# Patient Record
Sex: Female | Born: 1995 | Race: Black or African American | Hispanic: No | Marital: Married | Smoking: Never smoker
Health system: Southern US, Community
[De-identification: ages and names within clinical notes are randomized; demographics above are authoritative.]

## PROBLEM LIST (undated history)

## (undated) DIAGNOSIS — I10 Essential (primary) hypertension: Secondary | ICD-10-CM

## (undated) DIAGNOSIS — N390 Urinary tract infection, site not specified: Secondary | ICD-10-CM

## (undated) DIAGNOSIS — Z789 Other specified health status: Secondary | ICD-10-CM

## (undated) HISTORY — DX: Urinary tract infection, site not specified: N39.0

## (undated) HISTORY — DX: Other specified health status: Z78.9

## (undated) HISTORY — PX: TONSILLECTOMY: SUR1361

---
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
## 2016-06-29 ENCOUNTER — Encounter (INDEPENDENT_AMBULATORY_CARE_PROVIDER_SITE_OTHER): Payer: Self-pay

## 2016-06-29 ENCOUNTER — Encounter: Payer: Self-pay | Admitting: Family Medicine

## 2016-06-29 ENCOUNTER — Ambulatory Visit (INDEPENDENT_AMBULATORY_CARE_PROVIDER_SITE_OTHER): Payer: BLUE CROSS/BLUE SHIELD | Admitting: Family Medicine

## 2016-06-29 DIAGNOSIS — IMO0001 Reserved for inherently not codable concepts without codable children: Secondary | ICD-10-CM

## 2016-06-29 DIAGNOSIS — R030 Elevated blood-pressure reading, without diagnosis of hypertension: Secondary | ICD-10-CM

## 2016-06-29 DIAGNOSIS — I10 Essential (primary) hypertension: Secondary | ICD-10-CM | POA: Insufficient documentation

## 2016-06-29 DIAGNOSIS — E669 Obesity, unspecified: Secondary | ICD-10-CM | POA: Diagnosis not present

## 2016-06-29 NOTE — Patient Instructions (Addendum)
Nice to meet you. Please start working on diet and exercise as we discussed. I have included some dietary instructions. We will have our return for a physical and recheck blood pressure at that time.  Patient can be placed in any 11:15 time slot for a 30 minute visit if this works for her. If this does not work for her reschedule her in the first 30 minute office visit that is available and schedule a nurse visit for 2 weeks for a blood pressure check.

## 2016-06-29 NOTE — Assessment & Plan Note (Signed)
Slightly elevated on initial check and still slightly elevated on recheck. Asymptomatic. Likely related to obesity and family history. Discussed diet and exercise. She will return for a physical in the next 2-4 weeks and we will recheck this. If unable to come for a physical at that time. We will have her return for nurse visit in 2 weeks for recheck. She will also check at home.

## 2016-06-29 NOTE — Progress Notes (Signed)
Pre visit review using our clinic review tool, if applicable. No additional management support is needed unless otherwise documented below in the visit note. 

## 2016-06-29 NOTE — Assessment & Plan Note (Signed)
Discussed the importance of diet and exercise. Patient will start exercising 2-3 days a week and incorporate more fruits and vegetables into her diet.

## 2016-06-29 NOTE — Progress Notes (Signed)
Marikay AlarEric Yaneli Keithley, MD Phone: [phone]  Carrie Skinner is a 20 y.o. female who presents today for new patient visit.  Elevated blood pressure: Patient notes no prior history of elevated blood pressure. Notes she has gained some weight recently and this may be contributing. Has not been very active recently also eating out a lot. Does note a family history of hypertension. Family history of a cousin who had a stroke in his 1530s.  Obesity: Patient notes she has gained some weight recently. Is not doing anything for exercise at this time. Does not eat well. Eats out a fair amount. Notes in the past she has been able to drop weight very easily when she gets on a schedule. Used to be fairly physically active and played multiple sports in high school.  Active Ambulatory Problems    Diagnosis Date Noted  . Elevated blood pressure 06/29/2016  . Obesity 06/29/2016   Resolved Ambulatory Problems    Diagnosis Date Noted  . No Resolved Ambulatory Problems   Past Medical History:  Diagnosis Date  . UTI (lower urinary tract infection)     Family History  Problem Relation Age of Onset  . Arthritis Other   . Breast cancer Other   . Hypertension Other   . Diabetes Other   . Stroke Cousin     Early 30s    Social History   Social History  . Marital status: Single    Spouse name: N/A  . Number of children: N/A  . Years of education: N/A   Occupational History  . Not on file.   Social History Main Topics  . Smoking status: Never Smoker  . Smokeless tobacco: Never Used  . Alcohol use 1.2 oz/week    2 Cans of beer per week  . Drug use: No  . Sexual activity: Yes    Partners: Male    Birth control/ protection: Pill   Other Topics Concern  . 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    ROS  General:  Negative for nexplained weight loss, fever Skin: Negative for new or changing mole, sore that won't heal HEENT: Negative for  trouble hearing, trouble seeing, ringing in ears, mouth sores, hoarseness, change in voice, dysphagia. CV:  Negative for chest pain, dyspnea, edema, palpitations Resp: Negative for cough, dyspnea, hemoptysis GI: Negative for nausea, vomiting, diarrhea, constipation, abdominal pain, melena, hematochezia. GU: Negative for dysuria, incontinence, urinary hesitance, hematuria, vaginal or penile discharge, polyuria, sexual difficulty, lumps in testicle or breasts MSK: Negative for muscle cramps or aches, joint pain or swelling Neuro: Negative for headaches, weakness, numbness, dizziness, passing out/fainting Psych: Negative for depression, anxiety, memory problems  Objective  Physical Exam Vitals:   06/29/16 1435 06/29/16 1452  BP: (!) 148/96 (!) 134/96  Pulse: 83   Temp: 98.2 F (36.8 C)     BP Readings from Last 3 Encounters:  06/29/16 (!) 134/96   Wt Readings from Last 3 Encounters:  06/29/16 235 lb 6.4 oz (106.8 kg)    Physical Exam  Constitutional: No distress.  HENT:  Head: Normocephalic and atraumatic.  Mouth/Throat: Oropharynx is clear and moist. No oropharyngeal exudate.  Eyes: Conjunctivae are normal. Pupils are equal, round, and reactive to light.  Neck: Neck supple.  Cardiovascular: Normal rate, regular rhythm and normal heart sounds.   Pulmonary/Chest: Effort normal and breath sounds normal.  Abdominal: Soft. Bowel sounds are normal. She exhibits no distension. There is  no tenderness.  Musculoskeletal: She exhibits no edema.  Lymphadenopathy:    She has no cervical adenopathy.  Neurological: She is alert. Gait normal.  Skin: Skin is warm and dry. She is not diaphoretic.  Psychiatric: Mood and affect normal.     Assessment/Plan:   Elevated blood pressure Slightly elevated on initial check and still slightly elevated on recheck. Asymptomatic. Likely related to obesity and family history. Discussed diet and exercise. She will return for a physical in the next 2-4  weeks and we will recheck this. If unable to come for a physical at that time. We will have her return for nurse visit in 2 weeks for recheck. She will also check at home.  Obesity Discussed the importance of diet and exercise. Patient will start exercising 2-3 days a week and incorporate more fruits and vegetables into her diet.   No orders of the defined types were placed in this encounter.   No orders of the defined types were placed in this encounter.    Marikay Alar, MD [facility]

## 2016-07-08 ENCOUNTER — Institutional Professional Consult (permissible substitution): Payer: BLUE CROSS/BLUE SHIELD

## 2017-02-09 ENCOUNTER — Encounter: Payer: Self-pay | Admitting: Family Medicine

## 2017-02-09 ENCOUNTER — Other Ambulatory Visit (HOSPITAL_COMMUNITY)
Admission: RE | Admit: 2017-02-09 | Discharge: 2017-02-09 | Disposition: A | Discharge status: Home / Self Care | Payer: BLUE CROSS/BLUE SHIELD | Source: Ambulatory Visit | Attending: Family Medicine | Admitting: Family Medicine

## 2017-02-09 ENCOUNTER — Ambulatory Visit (INDEPENDENT_AMBULATORY_CARE_PROVIDER_SITE_OTHER): Payer: BLUE CROSS/BLUE SHIELD | Admitting: Family Medicine

## 2017-02-09 VITALS — BP 142/100 | HR 100 | Temp 99.1°F | Ht 65.5 in | Wt 245.0 lb

## 2017-02-09 DIAGNOSIS — I10 Essential (primary) hypertension: Secondary | ICD-10-CM | POA: Diagnosis not present

## 2017-02-09 DIAGNOSIS — Z13220 Encounter for screening for lipoid disorders: Secondary | ICD-10-CM

## 2017-02-09 DIAGNOSIS — Z124 Encounter for screening for malignant neoplasm of cervix: Secondary | ICD-10-CM | POA: Diagnosis not present

## 2017-02-09 DIAGNOSIS — Z114 Encounter for screening for human immunodeficiency virus [HIV]: Secondary | ICD-10-CM | POA: Insufficient documentation

## 2017-02-09 DIAGNOSIS — Z0001 Encounter for general adult medical examination with abnormal findings: Secondary | ICD-10-CM | POA: Diagnosis not present

## 2017-02-09 LAB — COMPREHENSIVE METABOLIC PANEL
ALT: 13 U/L (ref 0–35)
AST: 14 U/L (ref 0–37)
Albumin: 4.3 g/dL (ref 3.5–5.2)
Alkaline Phosphatase: 44 U/L (ref 39–117)
BUN: 11 mg/dL (ref 6–23)
CO2: 25 meq/L (ref 19–32)
Calcium: 9.4 mg/dL (ref 8.4–10.5)
Chloride: 104 mEq/L (ref 96–112)
Creatinine, Ser: 0.65 mg/dL (ref 0.40–1.20)
GFR: 145.68 mL/min (ref >60.00)
GLUCOSE: 101 mg/dL — AB (ref 70–99)
POTASSIUM: 3.9 meq/L (ref 3.5–5.1)
SODIUM: 136 meq/L (ref 135–145)
Total Bilirubin: 0.3 mg/dL (ref 0.2–1.2)
Total Protein: 7.3 g/dL (ref 6.0–8.3)

## 2017-02-09 LAB — LIPID PANEL
Cholesterol: 148 mg/dL (ref 0–200)
HDL: 46.4 mg/dL (ref >39.00)
LDL Cholesterol: 79 mg/dL (ref 0–99)
NonHDL: 101.95
Total CHOL/HDL Ratio: 3
Triglycerides: 117 mg/dL (ref 0.0–149.0)
VLDL: 23.4 mg/dL (ref 0.0–40.0)

## 2017-02-09 LAB — HEMOGLOBIN A1C: HEMOGLOBIN A1C: 5.5 % (ref 4.6–6.5)

## 2017-02-09 NOTE — Progress Notes (Signed)
Pre visit review using our clinic review tool, if applicable. No additional management support is needed unless otherwise documented below in the visit note. 

## 2017-02-09 NOTE — Patient Instructions (Signed)
Nice to see you. We will get some lab work and send your Pap smear off. Once these return we will give me a call. You should work on diet and exercises as we discussed. The goal is 150 minutes of exercise weekly.

## 2017-02-09 NOTE — Progress Notes (Signed)
Tommi Rumps, MD Phone: [phone]  Carrie Skinner is a 21 y.o. female who presents today for physical exam.  Patient exercises somewhat by walking. Diet is okay some weeks and not okay other weeks. Does eat a lot of fast food on the weekends. Has not been watching her sodium content. Up-to-date on vaccinations other than flu shot. She declines flu shot today. She is due for her first Pap smear. No prior HIV testing. She has a menstrual cycle once monthly. She sexually active with one partner. No history of STDs. No tobacco use. Occasional alcohol use. No illicit drug use. Blood pressure elevated today and has been elevated in the past. She does have family history of hypertension. She's never been on medication for this.  Active Ambulatory Problems    Diagnosis Date Noted  . Hypertension 06/29/2016  . Obesity 06/29/2016  . Encounter for general adult medical examination with abnormal findings 02/09/2017   Resolved Ambulatory Problems    Diagnosis Date Noted  . No Resolved Ambulatory Problems   Past Medical History:  Diagnosis Date  . UTI (lower urinary tract infection)     Family History  Problem Relation Age of Onset  . Arthritis Other   . Breast cancer Other   . Hypertension Other   . Diabetes Other   . Stroke Cousin     Early 73s    Social History   Social History  . Marital status: Single    Spouse name: N/A  . Number of children: N/A  . Years of education: N/A   Occupational History  . Not on file.   Social History Main Topics  . Smoking status: Never Smoker  . Smokeless tobacco: Never Used  . Alcohol use 1.2 oz/week    2 Cans of beer per week  . Drug use: No  . Sexual activity: Yes    Partners: Male    Birth control/ protection: Pill   Other Topics Concern  . Not on file   Social History Narrative   Patient is a Electronics engineer at [employer].    ROS  General:  Negative for nexplained weight loss, fever Skin: Negative for new or  changing mole, sore that won't heal HEENT: Negative for trouble hearing, trouble seeing, ringing in ears, mouth sores, hoarseness, change in voice, dysphagia. CV:  Negative for chest pain, dyspnea, edema, palpitations Resp: Negative for cough, dyspnea, hemoptysis GI: Negative for nausea, vomiting, diarrhea, constipation, abdominal pain, melena, hematochezia. GU: Negative for dysuria, incontinence, urinary hesitance, hematuria, vaginal or penile discharge, polyuria, sexual difficulty, lumps in testicle or breasts MSK: Negative for muscle cramps or aches, joint pain or swelling Neuro: Negative for headaches, weakness, numbness, dizziness, passing out/fainting Psych: Negative for depression, anxiety, memory problems  Objective  Physical Exam Vitals:   02/09/17 0806 02/09/17 0833  BP: (!) 170/90 (!) 142/100  Pulse: 100   Temp: 99.1 F (37.3 C)     BP Readings from Last 3 Encounters:  02/09/17 (!) 142/100  06/29/16 (!) 134/96   Wt Readings from Last 3 Encounters:  02/09/17 245 lb (111.1 kg)  06/29/16 235 lb 6.4 oz (106.8 kg)    Physical Exam  Constitutional: No distress.  HENT:  Head: Normocephalic and atraumatic.  Mouth/Throat: Oropharynx is clear and moist. No oropharyngeal exudate.  Eyes: Conjunctivae are normal. Pupils are equal, round, and reactive to light.  Neck: Neck supple.  Cardiovascular: Normal rate, regular rhythm and normal heart sounds.   Pulmonary/Chest: Effort normal and breath sounds normal.  Abdominal: Soft. Bowel sounds are normal. She exhibits no distension. There is no tenderness. There is no rebound and no guarding.  Genitourinary:  Genitourinary Comments: Normal labia, normal vaginal mucosa, normal cervix, normal bimanual exam  Musculoskeletal: She exhibits no edema.  Lymphadenopathy:    She has no cervical adenopathy.  Neurological: She is alert. Gait normal.  Skin: Skin is warm and dry. She is not diaphoretic.  Psychiatric: Mood and affect normal.       Assessment/Plan:   Encounter for general adult medical examination with abnormal findings Physical exam completed today. Discussed diet and exercise with the patient. Offered flu shot. Other vaccinations up-to-date. Pap smear completed with gonorrhea and chlamydia testing giving age. HIV testing will be completed as well. Other lab work as outlined below.  Hypertension Patient has blood pressure elevated on multiple checks in the office. She reports on one occasion she has checked it since her last visit and it was 073 systolically. Discussed that my recommendation would be to start on medicine for this though she would prefer to start working on diet and exercise and try to lose weight first. We will have her follow-up in a month to follow-up on weight loss and her blood pressure.   Orders Placed This Encounter  Procedures  . HIV antibody (with reflex)  . Comp Met (CMET)  . Lipid Profile  . HgB A1c    Tommi Rumps, MD [place]

## 2017-02-09 NOTE — Assessment & Plan Note (Signed)
Physical exam completed today. Discussed diet and exercise with the patient. Offered flu shot. Other vaccinations up-to-date. Pap smear completed with gonorrhea and chlamydia testing giving age. HIV testing will be completed as well. Other lab work as outlined below.

## 2017-02-09 NOTE — Assessment & Plan Note (Signed)
Patient has blood pressure elevated on multiple checks in the office. She reports on one occasion she has checked it since her last visit and it was 160 systolically. Discussed that my recommendation would be to start on medicine for this though she would prefer to start working on diet and exercise and try to lose weight first. We will have her follow-up in a month to follow-up on weight loss and her blood pressure.

## 2017-02-10 LAB — CYTOLOGY - PAP
CHLAMYDIA, DNA PROBE: NEGATIVE
Diagnosis: NEGATIVE
Neisseria Gonorrhea: NEGATIVE

## 2017-02-13 LAB — HIV ANTIBODY (ROUTINE TESTING W REFLEX): HIV 1&2 Ab, 4th Generation: NONREACTIVE

## 2017-03-12 ENCOUNTER — Ambulatory Visit: Payer: BLUE CROSS/BLUE SHIELD | Admitting: Family Medicine

## 2017-06-01 ENCOUNTER — Telehealth: Payer: Self-pay | Admitting: Family Medicine

## 2017-06-01 NOTE — Telephone Encounter (Signed)
Pt called office back. Please call.

## 2017-06-02 ENCOUNTER — Telehealth: Payer: Self-pay | Admitting: Family Medicine

## 2017-06-02 NOTE — Telephone Encounter (Signed)
Patient's sister dropped off form for her health examination certificate. She will need a TB test. Please get her set up for this. Once this returns we can complete the form. Thanks.

## 2017-06-03 NOTE — Telephone Encounter (Signed)
Patient has been schedule for 7.24.18 @ 3:30.

## 2017-06-03 NOTE — Telephone Encounter (Signed)
Patient states she will call back to schedule.

## 2017-06-15 ENCOUNTER — Ambulatory Visit: Payer: BLUE CROSS/BLUE SHIELD

## 2017-06-29 ENCOUNTER — Ambulatory Visit (INDEPENDENT_AMBULATORY_CARE_PROVIDER_SITE_OTHER): Payer: BLUE CROSS/BLUE SHIELD | Admitting: *Deleted

## 2017-06-29 DIAGNOSIS — Z111 Encounter for screening for respiratory tuberculosis: Secondary | ICD-10-CM

## 2017-06-29 NOTE — Progress Notes (Signed)
Patient presented for PPD testing 

## 2017-07-01 ENCOUNTER — Ambulatory Visit (INDEPENDENT_AMBULATORY_CARE_PROVIDER_SITE_OTHER): Payer: BLUE CROSS/BLUE SHIELD

## 2017-07-01 DIAGNOSIS — Z111 Encounter for screening for respiratory tuberculosis: Secondary | ICD-10-CM

## 2017-07-01 LAB — TB SKIN TEST
Induration: 0 mm
TB Skin Test: NEGATIVE

## 2017-07-01 NOTE — Progress Notes (Signed)
Patient comes in today for TB reading. Read on right arm. TB was negative.  Patient forms in forms to be filled folder for patient physical form. Please fax form when completed to fax number provided on top of form. Give original form to patient.

## 2017-07-02 NOTE — Progress Notes (Signed)
I have reviewed the above note and agree.  Otisha Spickler, M.D.  

## 2018-02-11 ENCOUNTER — Telehealth: Payer: Self-pay | Admitting: Family Medicine

## 2018-02-11 MED ORDER — SPRINTEC 28 0.25-35 MG-MCG PO TABS: 1.0000 | ORAL_TABLET | Freq: Every day | ORAL | 0 refills | Status: DC

## 2018-02-11 NOTE — Telephone Encounter (Signed)
Copied from CRM [phone]. Topic: Quick Communication - Rx Refill/Question >> Feb 11, 2018  1:18 PM Eston Mouldavis, Katrina Brosh B wrote: Medication:  SPRINTEC 28 0.25-35 MG-MCG tablet  Has the patient contacted their pharmacy? yes   (Agent: If no, request that the patient contact the pharmacy for the refill.)  Preferred Pharmacy (with phone number or street name): [id number]- [place], [place] - [address] [id number] (Phone) [phone] (Fax)      Agent: Please be advised that RX refills may take up to 3 business days. We ask that you follow-up with your pharmacy.

## 2018-03-11 ENCOUNTER — Other Ambulatory Visit: Payer: Self-pay | Admitting: Family Medicine

## 2018-03-14 NOTE — Telephone Encounter (Signed)
Patient last saw you on 02-09-17 and does not have an appointment scheduled would you like to refill?

## 2018-03-15 NOTE — Telephone Encounter (Signed)
One month of refills sent to pharmacy. She needs to set up a physical for further refills. Please call her to get her scheduled. Thanks.

## 2018-03-16 NOTE — Telephone Encounter (Signed)
Left voicemail for patient that sprintec has been sent to pharmacy and for further refill she needs to schedule and appointment and to give the office so that we can get that scheduled.

## 2019-06-26 ENCOUNTER — Other Ambulatory Visit: Payer: Self-pay

## 2019-06-28 ENCOUNTER — Other Ambulatory Visit: Payer: Self-pay

## 2019-06-28 ENCOUNTER — Ambulatory Visit: Payer: BLUE CROSS/BLUE SHIELD | Admitting: Family Medicine

## 2019-06-28 ENCOUNTER — Encounter: Payer: Self-pay | Admitting: Family Medicine

## 2019-06-28 VITALS — BP 130/80 | HR 91 | Temp 99.3°F | Ht 66.0 in | Wt 223.0 lb

## 2019-06-28 DIAGNOSIS — I10 Essential (primary) hypertension: Secondary | ICD-10-CM | POA: Diagnosis not present

## 2019-06-28 LAB — POCT URINALYSIS DIPSTICK
Bilirubin, UA: NEGATIVE
Blood, UA: NEGATIVE
Glucose, UA: NEGATIVE
Ketones, UA: POSITIVE
Leukocytes, UA: NEGATIVE
Nitrite, UA: NEGATIVE
Protein, UA: NEGATIVE
Spec Grav, UA: 1.025 (ref 1.010–1.025)
Urobilinogen, UA: 0.2 E.U./dL
pH, UA: 5.5 (ref 5.0–8.0)

## 2019-06-28 LAB — LIPID PANEL
Cholesterol: 158 mg/dL (ref 0–200)
HDL: 41.6 mg/dL (ref >39.00)
LDL Cholesterol: 101 mg/dL — ABNORMAL HIGH (ref 0–99)
NonHDL: 116.16
Total CHOL/HDL Ratio: 4
Triglycerides: 77 mg/dL (ref 0.0–149.0)
VLDL: 15.4 mg/dL (ref 0.0–40.0)

## 2019-06-28 LAB — COMPREHENSIVE METABOLIC PANEL
ALT: 14 U/L (ref 0–35)
AST: 16 U/L (ref 0–37)
Albumin: 4.7 g/dL (ref 3.5–5.2)
Alkaline Phosphatase: 48 U/L (ref 39–117)
BUN: 14 mg/dL (ref 6–23)
CO2: 25 mEq/L (ref 19–32)
Calcium: 9.7 mg/dL (ref 8.4–10.5)
Chloride: 102 mEq/L (ref 96–112)
Creatinine, Ser: 0.57 mg/dL (ref 0.40–1.20)
GFR: 156.3 mL/min (ref >60.00)
Glucose, Bld: 73 mg/dL (ref 70–99)
Potassium: 3.9 mEq/L (ref 3.5–5.1)
Sodium: 135 mEq/L (ref 135–145)
Total Bilirubin: 0.5 mg/dL (ref 0.2–1.2)
Total Protein: 7.4 g/dL (ref 6.0–8.3)

## 2019-06-28 LAB — CBC
HCT: 42.1 % (ref 36.0–46.0)
Hemoglobin: 14.3 g/dL (ref 12.0–15.0)
MCHC: 34 g/dL (ref 30.0–36.0)
MCV: 88.5 fl (ref 78.0–100.0)
Platelets: 254 10*3/uL (ref 150.0–400.0)
RBC: 4.76 Mil/uL (ref 3.87–5.11)
RDW: 13.3 % (ref 11.5–15.5)
WBC: 8.9 10*3/uL (ref 4.0–10.5)

## 2019-06-28 LAB — TSH: TSH: 1.52 u[IU]/mL (ref 0.35–4.50)

## 2019-06-28 NOTE — Assessment & Plan Note (Addendum)
BP elevated in several settings. Prior BP elevated though patient lost to follow-up. Recheck today is improved in to the normal range.  There could be some measure of whitecoat hypertension though given intermittent elevated BP we will proceed with evaluation for this. EKG reassuring. Labs as outlined below. Given twin sister's history of lupus we will check an ANA. She will monitor at home and contact us in 2 weeks with the readings. She will continue with diet and exercise. Discussed the potential for a renal duplex in the future. We will contact her with her lab results.

## 2019-06-28 NOTE — Patient Instructions (Addendum)
Nice to see you. We will get lab work today and contact you with the results.  Please continue to work on diet and exercise.  Please continue to monitor your blood pressure at home. We will see you back for your physical in September.

## 2019-06-28 NOTE — Progress Notes (Signed)
Tommi Rumps, MD Phone: [phone]  Carrie Skinner is a 23 y.o. female who presents today for follow-up.  Hypertension: Patient lost to follow-up for greater than 2 years.  Presents today noting that her blood pressure was 150/110 when checked at the dentist's office.  She notes it came down 149/99.  She has checked it at home on one occasion recently and it was similar to that.  She denies chest pain or shortness of breath.  Rare swelling in her ankles after she has had salt or alcohol intake.  She does note a family history of hypertension at a young age in her 2 cousins and 1 of her cousins had a stroke.  She does note her mom has had blood pressure issues.  Her fraternal twin sister has had blood pressure issues as well though her blood pressure has gone to normal with treatment of lupus and the sister is no longer taking medication per the patient's report.  She does note she has been trying to lose weight this year and is down about 15 pounds. She is no longer on OCPs.  Social History   Tobacco Use  Smoking Status Never Smoker  Smokeless Tobacco Never Used     ROS see history of present illness  Objective  Physical Exam Vitals:   06/28/19 1347 06/28/19 1409  BP: (!) 150/80 130/80  Pulse: 91   Temp: 99.3 F (37.4 C)   SpO2: 100%     BP Readings from Last 3 Encounters:  06/28/19 130/80  02/09/17 (!) 142/100  06/29/16 (!) 134/96   Wt Readings from Last 3 Encounters:  06/28/19 223 lb (101.2 kg)  02/09/17 245 lb (111.1 kg)  06/29/16 235 lb 6.4 oz (106.8 kg)    Physical Exam Constitutional:      General: She is not in acute distress.    Appearance: She is not diaphoretic.  Cardiovascular:     Rate and Rhythm: Normal rate and regular rhythm.     Heart sounds: Normal heart sounds.     Comments: 2+ DP and PT pulses, no abdominal bruits Pulmonary:     Effort: Pulmonary effort is normal.     Breath sounds: Normal breath sounds.  Abdominal:     General: Bowel  sounds are normal. There is no distension.     Palpations: Abdomen is soft.     Tenderness: There is no abdominal tenderness. There is no guarding or rebound.  Musculoskeletal:     Right lower leg: No edema.     Left lower leg: No edema.  Skin:    General: Skin is warm and dry.  Neurological:     Mental Status: She is alert.    EKG: Normal sinus rhythm, rate 71, negative precordial T waves, no ischemic changes, no evidence of LVH  Assessment/Plan: Please see individual problem list.  HTN (hypertension) BP elevated in several settings. Prior BP elevated though patient lost to follow-up. Recheck today is improved in to the normal range.  There could be some measure of whitecoat hypertension though given intermittent elevated BP we will proceed with evaluation for this. EKG reassuring. Labs as outlined below. Given twin sister's history of lupus we will check an ANA. She will monitor at home and contact us in 2 weeks with the readings. She will continue with diet and exercise. Discussed the potential for a renal duplex in the future. We will contact her with her lab results.   Patient did ask about potential for breast reduction in  the future given that she feels her breasts are too large at this time.  They have gotten slightly smaller since she has lost some weight.  She wants to try [facility] Though She May Be Interested in Seeing a Psychiatric nurse in the Future.  I did discuss that for insurance to cover breast reduction surgery there often has to be an adverse effect from enlarged breasts.  She verbalized understanding.  Patient will follow-up at her physical in about 6 weeks.  Orders Placed This Encounter  Procedures  . Comp Met (CMET)  . TSH  . CBC  . Lipid panel  . Antinuclear Antib (ANA)  . Aldosterone + renin activity w/ ratio  . POCT Urinalysis Dipstick  . EKG 12-Lead    No orders of the defined types were placed in this encounter.    Tommi Rumps, MD  [place]

## 2019-07-04 LAB — ANA: Anti Nuclear Antibody (ANA): NEGATIVE

## 2019-07-04 LAB — ALDOSTERONE + RENIN ACTIVITY W/ RATIO
ALDO / PRA Ratio: 13.3 Ratio (ref 0.9–28.9)
Aldosterone: 8 ng/dL
Renin Activity: 0.6 ng/mL/h (ref 0.25–5.82)

## 2019-08-01 ENCOUNTER — Encounter: Payer: Self-pay | Admitting: *Deleted

## 2019-08-03 ENCOUNTER — Other Ambulatory Visit: Payer: Self-pay

## 2019-08-07 ENCOUNTER — Encounter: Payer: Self-pay | Admitting: Family Medicine

## 2019-08-07 ENCOUNTER — Ambulatory Visit (INDEPENDENT_AMBULATORY_CARE_PROVIDER_SITE_OTHER): Payer: BC Managed Care – PPO | Admitting: Family Medicine

## 2019-08-07 ENCOUNTER — Other Ambulatory Visit: Payer: Self-pay

## 2019-08-07 VITALS — BP 140/80 | HR 82 | Temp 98.3°F | Ht 66.0 in | Wt 222.2 lb

## 2019-08-07 DIAGNOSIS — Z0001 Encounter for general adult medical examination with abnormal findings: Secondary | ICD-10-CM

## 2019-08-07 DIAGNOSIS — H9202 Otalgia, left ear: Secondary | ICD-10-CM | POA: Diagnosis not present

## 2019-08-07 DIAGNOSIS — Z23 Encounter for immunization: Secondary | ICD-10-CM

## 2019-08-07 NOTE — Progress Notes (Signed)
Marikay AlarEric Pauline Trainer, MD Phone: [phone]  Carrie Skinner is a 23 y.o. female who presents today for CPE.  Exercise: Walking 3 miles daily and going to the gym. Diet: Eating healthy meats and veggies.  Also having smoothies.  No soda or sweet tea. Pap smear negative for abnormal cells on 02/09/2017.  She is going to establish with gynecology. Has a menstrual cycle once monthly lasting 5 days.  She did have 2 cycles in July though she had been out of her OCP for a few months at that time.  She is scheduling appointment with GYN to discuss this further. She reports a family history of breast cancer in her maternal grandmother in her late 3860s.  She reports a history of ovarian cancer in her maternal first cousin.  No family history of colon cancer.  Her mother had lymphoma. She is unsure about her last tetanus vaccine.  She declines flu vaccine. No tobacco use or illicit drug use.  She has 2-3 alcoholic beverages every 2 weeks. Sees a dentist and an ophthalmologist. Reports left ear pain.  Has history of allergies that typically consist of some sneezing and itchy dry eyes.  She took an Careers adviserAllegra several days ago and that helped with your pain.  No fevers. BPs have been in the 130-140/70s range at home.  Active Ambulatory Problems    Diagnosis Date Noted  . HTN (hypertension) 06/29/2016  . Obesity 06/29/2016  . Encounter for general adult medical examination with abnormal findings 02/09/2017  . Left ear pain 08/07/2019   Resolved Ambulatory Problems    Diagnosis Date Noted  . No Resolved Ambulatory Problems   Past Medical History:  Diagnosis Date  . UTI (lower urinary tract infection)     Family History  Problem Relation Age of Onset  . Arthritis Other   . Breast cancer Other   . Hypertension Other   . Diabetes Other   . Stroke Cousin        Early 30s    Social History   Socioeconomic History  . Marital status: Single    Spouse name: Not on file  . Number of children: Not on  file  . Years of education: Not on file  . Highest education level: Not on file  Occupational History  . Not on file  Social Needs  . Financial resource strain: Not on file  . Food insecurity    Worry: Not on file    Inability: Not on file  . Transportation needs    Medical: Not on file    Non-medical: Not on file  Tobacco Use  . Smoking status: Never Smoker  . Smokeless tobacco: Never Used  Substance and Sexual Activity  . Alcohol use: Yes    Alcohol/week: 2.0 standard drinks    Types: 2 Cans of beer per week  . Drug use: No  . Sexual activity: Yes    Partners: Male    Birth control/protection: Pill  Lifestyle  . Physical activity    Days per week: Not on file    Minutes per session: Not on file  . Stress: Not on file  Relationships  . Social Musicianconnections    Talks on phone: Not on file    Gets together: Not on file    Attends religious service: Not on file    Active member of club or organization: Not on file    Attends meetings of clubs or organizations: Not on file    Relationship status: Not on file  .  Intimate partner violence    Fear of current or ex partner: Not on file    Emotionally abused: Not on file    Physically abused: Not on file    Forced sexual activity: Not on file  Other Topics Concern  . Not on file  Social History Narrative   Patient is a Electronics engineer at [employer].    ROS  General:  Negative for nexplained weight loss, fever Skin: Negative for new or changing mole, sore that won't heal HEENT: Negative for trouble hearing, trouble seeing, ringing in ears, mouth sores, hoarseness, change in voice, dysphagia. CV:  Negative for chest pain, dyspnea, edema, palpitations Resp: Negative for cough, dyspnea, hemoptysis GI: Negative for nausea, vomiting, diarrhea, constipation, abdominal pain, melena, hematochezia. GU: Negative for dysuria, incontinence, urinary hesitance, hematuria, vaginal or penile discharge, polyuria, sexual difficulty,  lumps in testicle or breasts MSK: Negative for muscle cramps or aches, joint pain or swelling Neuro: Negative for headaches, weakness, numbness, dizziness, passing out/fainting Psych: Negative for depression, anxiety, memory problems  Objective  Physical Exam Vitals:   08/07/19 1351 08/07/19 1430  BP: 110/70 140/80  Pulse: 82   Temp: 98.3 F (36.8 C)   SpO2: 99%     BP Readings from Last 3 Encounters:  08/07/19 140/80  06/28/19 130/80  02/09/17 (!) 142/100   Wt Readings from Last 3 Encounters:  08/07/19 222 lb 3.2 oz (100.8 kg)  06/28/19 223 lb (101.2 kg)  02/09/17 245 lb (111.1 kg)    Physical Exam Constitutional:      General: She is not in acute distress.    Appearance: She is not diaphoretic.  HENT:     Head: Normocephalic and atraumatic.     Right Ear: Tympanic membrane, ear canal and external ear normal.     Left Ear: Tympanic membrane and external ear normal.  Eyes:     Conjunctiva/sclera: Conjunctivae normal.     Pupils: Pupils are equal, round, and reactive to light.  Cardiovascular:     Rate and Rhythm: Normal rate and regular rhythm.     Heart sounds: Normal heart sounds.  Pulmonary:     Effort: Pulmonary effort is normal.     Breath sounds: Normal breath sounds.  Abdominal:     General: Bowel sounds are normal. There is no distension.     Palpations: Abdomen is soft.     Tenderness: There is no abdominal tenderness. There is no guarding or rebound.  Musculoskeletal:     Right lower leg: No edema.     Left lower leg: No edema.  Lymphadenopathy:     Cervical: No cervical adenopathy.  Skin:    General: Skin is warm and dry.  Neurological:     Mental Status: She is alert.  Psychiatric:        Mood and Affect: Mood normal.      Assessment/Plan:   Encounter for general adult medical examination with abnormal findings Physical exam completed.  Continue exercise and healthy diet.  She will establish with GYN for future Pap smears and breast exam.   She will let the GYN know about her family history of ovarian cancer to see if genetic counseling is warranted.  She will discuss contraceptive management with them as well.  She will continue to monitor her blood pressures as they appear to be normal at this time.  Left ear pain I suspect eustachian tube dysfunction.  It has improved with Allegra.  She will start daily Allegra for the  next several weeks and monitor.   Orders Placed This Encounter  Procedures  . Td : Tetanus/diphtheria >7yo Preservative  free    No orders of the defined types were placed in this encounter.    Marikay Alar, MD [facility]

## 2019-08-07 NOTE — Assessment & Plan Note (Signed)
Physical exam completed.  Continue exercise and healthy diet.  She will establish with GYN for future Pap smears and breast exam.  She will let the GYN know about her family history of ovarian cancer to see if genetic counseling is warranted.  She will discuss contraceptive management with them as well.  She will continue to monitor her blood pressures as they appear to be normal at this time.

## 2019-08-07 NOTE — Patient Instructions (Signed)
Nice to see you. Please continue to work on diet and exercise. Please see the gynecologist as discussed. Please try taking the Allegra every day.  If your symptoms return and do not respond to the Allegra please let us know. Please continue to monitor your blood pressure periodically. Please let us know if you change your mind and would like a flu vaccine.

## 2019-08-07 NOTE — Assessment & Plan Note (Signed)
I suspect eustachian tube dysfunction.  It has improved with Allegra.  She will start daily Allegra for the next several weeks and monitor.

## 2019-11-07 ENCOUNTER — Ambulatory Visit: Payer: BC Managed Care – PPO | Admitting: Family Medicine

## 2019-11-29 ENCOUNTER — Ambulatory Visit: Payer: BC Managed Care – PPO | Attending: Internal Medicine

## 2019-11-29 DIAGNOSIS — Z20822 Contact with and (suspected) exposure to covid-19: Secondary | ICD-10-CM

## 2019-11-30 LAB — NOVEL CORONAVIRUS, NAA: SARS-CoV-2, NAA: NOT DETECTED

## 2020-10-21 ENCOUNTER — Ambulatory Visit: Payer: BC Managed Care – PPO | Admitting: Cardiovascular Disease

## 2020-10-21 NOTE — Progress Notes (Deleted)
Hypertension Clinic Initial Assessment:    Date:  10/21/2020   ID:  Carrie Skinner, DOB December 21, 1995, MRN [id number]  PCP:  Glori Luis, MD  Cardiologist:  No primary care provider on file.  Nephrologist:  Referring MD: Maxie Better, MD   CC: Hypertension  History of Present Illness:    Carrie Skinner is a 24 y.o. female with a hx of *** here to establish care in the hypertension clinic.   She saw Dr. Cherly Hensen on 09/30/2020 and her blood pressure was 141/101.  She was referred to cardiology for evaluation of secondary causes given her age.   Previous antihypertensives:  Secondary Causes of Hypertension  Medications/Herbal: OCP, steroids, stimulants, antidepressants, weight loss medication, immune suppressants, NSAIDs, sympathomimetics, alcohol, caffeine, licorice, ginseng, St. John's wort, chemo  Sleep Apnea Renal artery stenosis Hyperaldosteronism Hyper/hypothyroidism Pheochromocytoma: palpitations, tachycardia, headache, diaphoresis (plasma metanephrines) Cushing's syndrome: Cushingoid facies, central obesity, proximal muscle weakness, and ecchymoses, adrenal incidentaloma (cortisol) Coarctation of the aorta  Past Medical History:  Diagnosis Date  . UTI (lower urinary tract infection)     *** The histories are not reviewed yet. Please review them in the "History" navigator section and refresh this SmartLink.  Current Medications: No outpatient medications have been marked as taking for the 10/21/20 encounter (Appointment) with Chilton Si, MD.     Allergies:   Patient has no known allergies.   Social History   Socioeconomic History  . Marital status: Single    Spouse name: Not on file  . Number of children: Not on file  . Years of education: Not on file  . Highest education level: Not on file  Occupational History  . Not on file  Tobacco Use  . Smoking status: Never Smoker  . Smokeless tobacco: Never Used  Substance and Sexual  Activity  . Alcohol use: Yes    Alcohol/week: 2.0 standard drinks    Types: 2 Cans of beer per week  . Drug use: No  . Sexual activity: Yes    Partners: Male    Birth control/protection: Pill  Other Topics Concern  . Not on file  Social History Narrative   Patient is a Archivist at [employer].   Social Determinants of Health   Financial Resource Strain:   . Difficulty of Paying Living Expenses: Not on file  Food Insecurity:   . Worried About Programme researcher, broadcasting/film/video in the Last Year: Not on file  . Ran Out of Food in the Last Year: Not on file  Transportation Needs:   . Lack of Transportation (Medical): Not on file  . Lack of Transportation (Non-Medical): Not on file  Physical Activity:   . Days of Exercise per Week: Not on file  . Minutes of Exercise per Session: Not on file  Stress:   . Feeling of Stress : Not on file  Social Connections:   . Frequency of Communication with Friends and Family: Not on file  . Frequency of Social Gatherings with Friends and Family: Not on file  . Attends Religious Services: Not on file  . Active Member of Clubs or Organizations: Not on file  . Attends Banker Meetings: Not on file  . Marital Status: Not on file     Family History: The patient's ***family history includes Arthritis in an other family member; Breast cancer in an other family member; Diabetes in an other family member; Hypertension in an other family member; Stroke in her cousin.  ROS:  Please see the history of present illness.    *** All other systems reviewed and are negative.  EKGs/Labs/Other Studies Reviewed:    EKG:  EKG is *** ordered today.  The ekg ordered today demonstrates ***  Recent Labs: No results found for requested labs within last 8760 hours.   Recent Lipid Panel    Component Value Date/Time   CHOL 158 06/28/2019 1430   TRIG 77.0 06/28/2019 1430   HDL 41.60 06/28/2019 1430   CHOLHDL 4 06/28/2019 1430   VLDL 15.4 06/28/2019  1430   LDLCALC 101 (H) 06/28/2019 1430    Physical Exam:    VS:  There were no vitals taken for this visit.    Wt Readings from Last 3 Encounters:  08/07/19 222 lb 3.2 oz (100.8 kg)  06/28/19 223 lb (101.2 kg)  02/09/17 245 lb (111.1 kg)     GEN: *** Well nourished, well developed in no acute distress HEENT: Normal NECK: No JVD; No carotid bruits LYMPHATICS: No lymphadenopathy CARDIAC: ***RRR, no murmurs, rubs, gallops RESPIRATORY:  Clear to auscultation without rales, wheezing or rhonchi  ABDOMEN: Soft, non-tender, non-distended MUSCULOSKELETAL:  No edema; No deformity  SKIN: Warm and dry NEUROLOGIC:  Alert and oriented x 3 PSYCHIATRIC:  Normal affect   ASSESSMENT:    No diagnosis found.  PLAN:    1. ***  she consents to be monitored in our remote patient monitoring program through [employer].  she will track his blood pressure twice daily and understands that these trends will help [place] to adjust her medications as needed prior to his next appointment.  she *** interested in enrolling in the PREP exercise and nutrition program through the [facility]     Disposition:    FU with MD/PharmD in {gen number [id number]} {Days to years:10300} {virtual or in-office}   Medication Adjustments/Labs and Tests Ordered: Current medicines are reviewed at length with the patient today.  Concerns regarding medicines are outlined above.  No orders of the defined types were placed in this encounter.  No orders of the defined types were placed in this encounter.    Signed, Chilton Si, MD  10/21/2020 8:36 AM    [facility]

## 2020-11-13 ENCOUNTER — Encounter: Payer: Self-pay | Admitting: *Deleted

## 2020-11-13 NOTE — Telephone Encounter (Signed)
This encounter was created in error - please disregard.

## 2020-12-18 ENCOUNTER — Ambulatory Visit: Payer: BC Managed Care – PPO | Admitting: Cardiovascular Disease

## 2021-01-10 ENCOUNTER — Ambulatory Visit: Payer: BC Managed Care – PPO | Admitting: Cardiovascular Disease

## 2021-07-14 ENCOUNTER — Other Ambulatory Visit: Payer: Self-pay | Admitting: Obstetrics and Gynecology

## 2021-07-14 DIAGNOSIS — O30032 Twin pregnancy, monochorionic/diamniotic, second trimester: Secondary | ICD-10-CM

## 2021-07-15 ENCOUNTER — Encounter: Payer: Self-pay | Admitting: *Deleted

## 2021-07-18 ENCOUNTER — Ambulatory Visit: Payer: No Typology Code available for payment source

## 2021-07-18 ENCOUNTER — Ambulatory Visit (HOSPITAL_BASED_OUTPATIENT_CLINIC_OR_DEPARTMENT_OTHER): Payer: No Typology Code available for payment source

## 2021-07-18 ENCOUNTER — Encounter: Payer: Self-pay | Admitting: *Deleted

## 2021-07-18 ENCOUNTER — Other Ambulatory Visit: Payer: Self-pay

## 2021-07-18 ENCOUNTER — Ambulatory Visit: Payer: No Typology Code available for payment source | Attending: Obstetrics and Gynecology | Admitting: *Deleted

## 2021-07-18 ENCOUNTER — Other Ambulatory Visit: Payer: Self-pay | Admitting: *Deleted

## 2021-07-18 ENCOUNTER — Ambulatory Visit: Payer: Self-pay | Attending: Obstetrics and Gynecology | Admitting: Obstetrics and Gynecology

## 2021-07-18 VITALS — BP 135/69 | HR 89

## 2021-07-18 DIAGNOSIS — O30032 Twin pregnancy, monochorionic/diamniotic, second trimester: Secondary | ICD-10-CM

## 2021-07-18 DIAGNOSIS — Z3A20 20 weeks gestation of pregnancy: Secondary | ICD-10-CM | POA: Insufficient documentation

## 2021-07-18 DIAGNOSIS — O99322 Drug use complicating pregnancy, second trimester: Secondary | ICD-10-CM

## 2021-07-18 DIAGNOSIS — F1910 Other psychoactive substance abuse, uncomplicated: Secondary | ICD-10-CM

## 2021-07-18 DIAGNOSIS — Z363 Encounter for antenatal screening for malformations: Secondary | ICD-10-CM

## 2021-07-18 DIAGNOSIS — O30002 Twin pregnancy, unspecified number of placenta and unspecified number of amniotic sacs, second trimester: Secondary | ICD-10-CM

## 2021-07-18 DIAGNOSIS — O322XX2 Maternal care for transverse and oblique lie, fetus 2: Secondary | ICD-10-CM

## 2021-07-18 NOTE — Progress Notes (Signed)
Maternal-Fetal Medicine   Name: Carrie Skinner DOB: 06-11-1996 MRN: [id number] Referring Provider: Maxie Better, MD  I had the pleasure of seeing Carrie Sk[facility] at [facility]. She is G2 P0 at 20w 3d gestation and is here for fetal anatomy scan.  Monochorionic-diamniotic twin pregnancy was diagnosed at 8-week ultrasound performed at your office.  Prenatal course: On cell free fetal DNA screening, the risks of fetal aneuploidies are not increased.  MSAFP screening showed low risk for open neural tube defects.  Past medical history: No history of diabetes or hypertension or any chronic medical conditions.  Patient reports she and her husband do not have sickle cell trait. Past surgical history: Tonsillectomy. Medications: Prenatal vitamins, low-dose aspirin. Allergies: No known drug allergies. Social history: Denies tobacco or drug or alcohol use.  She has been married for months and her husband is in good health.  Both patient and her husband are African-Americans. Family history: No history of venous thromboembolism in the family.  Blood pressure today at her office is 135/69 mmHg.  Ultrasound We performed fetal anatomical survey.  Monochorionic-diamniotic twin pregnancy seen. Twin A: Lower fetus, maternal left, cephalic presentation upon posterior placenta.  Amniotic fluid is normal and good fetal activity seen.  No markers of aneuploidies or fetal structural defects are seen.  Fetal biometry is consistent with the previously established dates.  Twin B: Upper fetus, transverse lie and head to maternal left, posterior placenta. Amniotic fluid is normal and good fetal activity seen.  No markers of aneuploidies or fetal structural defects are seen.  Fetal biometry is consistent with the previously established dates.  No growth discordancy seen.  On transabdominal scan, the cervix looks long and closed.  Our concerns include Monochorionic-diamniotic twin  pregnancy -Explained chorionicity with diagrams and its implications. -Monochorionic twins have a higher rate of complications including miscarriages, congenital malformations, twin to twin transfusion syndrome (TTTS) (15%), selective growth restriction, and fetal demise of one or both twins. -Twin pregnancies are associated with increased likelihood of gestational diabetes, gestational hypertension, or preeclampsia, malpresentations, cesarean delivery and postpartum hemorrhage. -Preterm delivery is the most-common complication of twin pregnancies.  -I discussed ultrasound surveillance for TTTS every 2 weeks from 16 weeks until delivery. -I discussed timing and mode of delivery. We recommend delivery at 37 weeks in monochorionic twins to prevent the likelihood of stillbirth of one or both twins that is increased in monochorionic twins. Earlier delivery may be indicated if this pregnancy is complicated by fetal growth restriction or other maternal complications. -In vertex/vertex presentations, vaginal delivery can be safely undertaken. In non-vertex presentation of twin A, we recommend cesarean delivery. If first twin is vertex, and the second twin is non-vertex, either cesarean delivery or vaginal delivery of first twin followed by internal podalic version of second twin may be performed (depends on obstetrician's experience and patient's preference).  Low-dose aspirin is beneficial in delaying or preventing preeclampsia. I encouraged her to continue low-dose aspirin.  Recommendations -Follow-up scan in 2 weeks. -TTTS surveillance ultrasound every 2 weeks. -Fetal growth assessment every 4 weeks. -Weekly antenatal testing from 32 weeks. -Recommend delivery at 37 weeks.   Thank you for consultation.  If you have any questions or concerns, please contact me the [facility].  Consultation including face-to-face (more than 50%) counseling 45 minutes.

## 2021-07-30 ENCOUNTER — Telehealth: Payer: Self-pay

## 2021-07-30 NOTE — Telephone Encounter (Signed)
FETAL ECHO SCHEDULED FOR 08/05/2021@1P 

## 2021-08-01 ENCOUNTER — Other Ambulatory Visit: Payer: Self-pay

## 2021-08-01 ENCOUNTER — Ambulatory Visit: Payer: No Typology Code available for payment source | Admitting: *Deleted

## 2021-08-01 ENCOUNTER — Encounter: Payer: Self-pay | Admitting: *Deleted

## 2021-08-01 ENCOUNTER — Ambulatory Visit: Payer: No Typology Code available for payment source | Attending: Obstetrics and Gynecology

## 2021-08-01 VITALS — BP 141/80 | HR 79

## 2021-08-01 DIAGNOSIS — Z3A22 22 weeks gestation of pregnancy: Secondary | ICD-10-CM | POA: Diagnosis not present

## 2021-08-01 DIAGNOSIS — F1910 Other psychoactive substance abuse, uncomplicated: Secondary | ICD-10-CM

## 2021-08-01 DIAGNOSIS — O30032 Twin pregnancy, monochorionic/diamniotic, second trimester: Secondary | ICD-10-CM | POA: Diagnosis present

## 2021-08-01 DIAGNOSIS — O99322 Drug use complicating pregnancy, second trimester: Secondary | ICD-10-CM | POA: Diagnosis not present

## 2021-08-04 ENCOUNTER — Ambulatory Visit: Payer: Self-pay

## 2021-08-15 ENCOUNTER — Ambulatory Visit: Payer: No Typology Code available for payment source

## 2021-08-18 ENCOUNTER — Ambulatory Visit: Payer: No Typology Code available for payment source | Attending: Obstetrics and Gynecology

## 2021-08-18 ENCOUNTER — Other Ambulatory Visit: Payer: Self-pay

## 2021-08-18 ENCOUNTER — Ambulatory Visit: Payer: No Typology Code available for payment source | Admitting: *Deleted

## 2021-08-18 VITALS — BP 133/85 | HR 77

## 2021-08-18 DIAGNOSIS — O99322 Drug use complicating pregnancy, second trimester: Secondary | ICD-10-CM | POA: Insufficient documentation

## 2021-08-18 DIAGNOSIS — Z3A24 24 weeks gestation of pregnancy: Secondary | ICD-10-CM | POA: Diagnosis not present

## 2021-08-18 DIAGNOSIS — F1910 Other psychoactive substance abuse, uncomplicated: Secondary | ICD-10-CM | POA: Insufficient documentation

## 2021-08-18 DIAGNOSIS — O30032 Twin pregnancy, monochorionic/diamniotic, second trimester: Secondary | ICD-10-CM | POA: Diagnosis present

## 2021-08-18 DIAGNOSIS — O30039 Twin pregnancy, monochorionic/diamniotic, unspecified trimester: Secondary | ICD-10-CM | POA: Insufficient documentation

## 2021-08-18 DIAGNOSIS — Z363 Encounter for antenatal screening for malformations: Secondary | ICD-10-CM | POA: Diagnosis not present

## 2021-08-19 ENCOUNTER — Other Ambulatory Visit: Payer: Self-pay | Admitting: *Deleted

## 2021-08-19 DIAGNOSIS — O30039 Twin pregnancy, monochorionic/diamniotic, unspecified trimester: Secondary | ICD-10-CM

## 2021-08-21 ENCOUNTER — Ambulatory Visit: Payer: No Typology Code available for payment source

## 2021-08-29 ENCOUNTER — Encounter: Payer: Self-pay | Admitting: *Deleted

## 2021-08-29 ENCOUNTER — Ambulatory Visit: Payer: No Typology Code available for payment source | Admitting: *Deleted

## 2021-08-29 ENCOUNTER — Other Ambulatory Visit: Payer: Self-pay

## 2021-08-29 ENCOUNTER — Ambulatory Visit: Payer: No Typology Code available for payment source | Attending: Obstetrics and Gynecology

## 2021-08-29 VITALS — BP 150/80 | HR 74

## 2021-08-29 DIAGNOSIS — O99322 Drug use complicating pregnancy, second trimester: Secondary | ICD-10-CM | POA: Diagnosis not present

## 2021-08-29 DIAGNOSIS — O30032 Twin pregnancy, monochorionic/diamniotic, second trimester: Secondary | ICD-10-CM

## 2021-08-29 DIAGNOSIS — Z3A26 26 weeks gestation of pregnancy: Secondary | ICD-10-CM

## 2021-08-29 DIAGNOSIS — O99212 Obesity complicating pregnancy, second trimester: Secondary | ICD-10-CM

## 2021-09-12 ENCOUNTER — Ambulatory Visit: Payer: No Typology Code available for payment source | Attending: Obstetrics and Gynecology

## 2021-09-12 ENCOUNTER — Ambulatory Visit: Payer: No Typology Code available for payment source | Admitting: *Deleted

## 2021-09-12 ENCOUNTER — Other Ambulatory Visit: Payer: Self-pay

## 2021-09-12 VITALS — BP 152/85 | HR 73

## 2021-09-12 DIAGNOSIS — Z3A28 28 weeks gestation of pregnancy: Secondary | ICD-10-CM

## 2021-09-12 DIAGNOSIS — O30039 Twin pregnancy, monochorionic/diamniotic, unspecified trimester: Secondary | ICD-10-CM | POA: Diagnosis present

## 2021-09-12 DIAGNOSIS — O30032 Twin pregnancy, monochorionic/diamniotic, second trimester: Secondary | ICD-10-CM | POA: Insufficient documentation

## 2021-09-12 DIAGNOSIS — O99213 Obesity complicating pregnancy, third trimester: Secondary | ICD-10-CM

## 2021-09-12 DIAGNOSIS — O99323 Drug use complicating pregnancy, third trimester: Secondary | ICD-10-CM

## 2021-09-12 DIAGNOSIS — E669 Obesity, unspecified: Secondary | ICD-10-CM | POA: Diagnosis not present

## 2021-09-12 DIAGNOSIS — O30033 Twin pregnancy, monochorionic/diamniotic, third trimester: Secondary | ICD-10-CM

## 2021-09-26 ENCOUNTER — Encounter: Payer: Self-pay | Admitting: *Deleted

## 2021-09-26 ENCOUNTER — Other Ambulatory Visit: Payer: Self-pay | Admitting: *Deleted

## 2021-09-26 ENCOUNTER — Ambulatory Visit: Payer: No Typology Code available for payment source | Admitting: *Deleted

## 2021-09-26 ENCOUNTER — Ambulatory Visit: Payer: No Typology Code available for payment source | Attending: Obstetrics and Gynecology

## 2021-09-26 ENCOUNTER — Other Ambulatory Visit: Payer: Self-pay

## 2021-09-26 VITALS — BP 135/77 | HR 84

## 2021-09-26 DIAGNOSIS — O30039 Twin pregnancy, monochorionic/diamniotic, unspecified trimester: Secondary | ICD-10-CM

## 2021-09-26 DIAGNOSIS — F1910 Other psychoactive substance abuse, uncomplicated: Secondary | ICD-10-CM | POA: Diagnosis not present

## 2021-09-26 DIAGNOSIS — O99323 Drug use complicating pregnancy, third trimester: Secondary | ICD-10-CM

## 2021-09-26 DIAGNOSIS — O99213 Obesity complicating pregnancy, third trimester: Secondary | ICD-10-CM | POA: Diagnosis not present

## 2021-09-26 DIAGNOSIS — O30033 Twin pregnancy, monochorionic/diamniotic, third trimester: Secondary | ICD-10-CM

## 2021-09-26 DIAGNOSIS — Z3A30 30 weeks gestation of pregnancy: Secondary | ICD-10-CM

## 2021-10-10 ENCOUNTER — Other Ambulatory Visit: Payer: Self-pay | Admitting: Maternal & Fetal Medicine

## 2021-10-10 ENCOUNTER — Ambulatory Visit: Payer: No Typology Code available for payment source | Admitting: *Deleted

## 2021-10-10 ENCOUNTER — Other Ambulatory Visit: Payer: Self-pay

## 2021-10-10 ENCOUNTER — Encounter: Payer: Self-pay | Admitting: *Deleted

## 2021-10-10 ENCOUNTER — Ambulatory Visit: Payer: No Typology Code available for payment source | Attending: Obstetrics and Gynecology

## 2021-10-10 VITALS — BP 141/82 | HR 86

## 2021-10-10 DIAGNOSIS — O30039 Twin pregnancy, monochorionic/diamniotic, unspecified trimester: Secondary | ICD-10-CM

## 2021-10-10 DIAGNOSIS — O283 Abnormal ultrasonic finding on antenatal screening of mother: Secondary | ICD-10-CM

## 2021-10-10 DIAGNOSIS — O99213 Obesity complicating pregnancy, third trimester: Secondary | ICD-10-CM | POA: Diagnosis not present

## 2021-10-10 DIAGNOSIS — E669 Obesity, unspecified: Secondary | ICD-10-CM

## 2021-10-10 DIAGNOSIS — O99323 Drug use complicating pregnancy, third trimester: Secondary | ICD-10-CM | POA: Diagnosis not present

## 2021-10-10 DIAGNOSIS — F1910 Other psychoactive substance abuse, uncomplicated: Secondary | ICD-10-CM

## 2021-10-10 DIAGNOSIS — O30033 Twin pregnancy, monochorionic/diamniotic, third trimester: Secondary | ICD-10-CM | POA: Diagnosis not present

## 2021-10-10 DIAGNOSIS — Z3A32 32 weeks gestation of pregnancy: Secondary | ICD-10-CM

## 2021-10-10 NOTE — Procedures (Signed)
Carrie Skinner [birth year] [redacted]w[redacted]d   Fetus B Non-Stress Test Interpretation for 10/10/21  Indication:  mo di twins  Fetal Heart Rate Fetus B Mode: External Baseline Rate (B): 140 BPM Variability: Moderate Accelerations: 15 x 15 Decelerations: None  Uterine Activity Mode: Palpation, Toco Contraction Frequency (min): 1 uc Contraction Duration (sec): 110 Contraction Quality: Mild Resting Tone Palpated: Relaxed Resting Time: Adequate  Interpretation (Baby B - Fetal Testing) Nonstress Test Interpretation (Baby B): Reactive Overall Impression (Baby B): Reassuring for gestational age Comments (Baby B): Dr. Parke Poisson reviewed tracing.  Carrie Skinner [birth year] [redacted]w[redacted]d  Fetus A Non-Stress Test Interpretation for 10/10/21  Indication: Unsatisfactory BPP  Fetal Heart Rate A Mode: External Baseline Rate (A): 135 bpm Variability: Moderate Accelerations: 15 x 15 Decelerations: None Multiple birth?: Yes  Uterine Activity Mode: Palpation, Toco Contraction Frequency (min): 1 uc Contraction Duration (sec): 110 Contraction Quality: Mild Resting Tone Palpated: Relaxed Resting Time: Adequate  Interpretation (Fetal Testing) Nonstress Test Interpretation: Reactive Overall Impression: Reassuring for gestational age Comments: Dr. Parke Poisson reviewed tracing

## 2021-10-21 ENCOUNTER — Ambulatory Visit: Payer: No Typology Code available for payment source | Attending: Maternal & Fetal Medicine

## 2021-10-21 ENCOUNTER — Encounter: Payer: Self-pay | Admitting: *Deleted

## 2021-10-21 ENCOUNTER — Ambulatory Visit: Payer: No Typology Code available for payment source | Admitting: *Deleted

## 2021-10-21 ENCOUNTER — Other Ambulatory Visit: Payer: Self-pay

## 2021-10-21 VITALS — BP 141/87 | HR 88

## 2021-10-21 DIAGNOSIS — O30039 Twin pregnancy, monochorionic/diamniotic, unspecified trimester: Secondary | ICD-10-CM | POA: Insufficient documentation

## 2021-10-21 DIAGNOSIS — O10013 Pre-existing essential hypertension complicating pregnancy, third trimester: Secondary | ICD-10-CM

## 2021-10-21 DIAGNOSIS — O99322 Drug use complicating pregnancy, second trimester: Secondary | ICD-10-CM | POA: Diagnosis not present

## 2021-10-21 DIAGNOSIS — O30033 Twin pregnancy, monochorionic/diamniotic, third trimester: Secondary | ICD-10-CM | POA: Insufficient documentation

## 2021-10-21 DIAGNOSIS — Z3A34 34 weeks gestation of pregnancy: Secondary | ICD-10-CM

## 2021-10-28 ENCOUNTER — Other Ambulatory Visit: Payer: Self-pay

## 2021-10-28 ENCOUNTER — Encounter: Payer: Self-pay | Admitting: *Deleted

## 2021-10-28 ENCOUNTER — Ambulatory Visit: Payer: No Typology Code available for payment source | Admitting: *Deleted

## 2021-10-28 ENCOUNTER — Ambulatory Visit: Payer: No Typology Code available for payment source | Attending: Maternal & Fetal Medicine

## 2021-10-28 VITALS — BP 150/94 | HR 68

## 2021-10-28 DIAGNOSIS — E669 Obesity, unspecified: Secondary | ICD-10-CM

## 2021-10-28 DIAGNOSIS — O30033 Twin pregnancy, monochorionic/diamniotic, third trimester: Secondary | ICD-10-CM

## 2021-10-28 DIAGNOSIS — Z3A35 35 weeks gestation of pregnancy: Secondary | ICD-10-CM

## 2021-10-28 DIAGNOSIS — O30039 Twin pregnancy, monochorionic/diamniotic, unspecified trimester: Secondary | ICD-10-CM

## 2021-10-28 DIAGNOSIS — O99213 Obesity complicating pregnancy, third trimester: Secondary | ICD-10-CM

## 2021-10-28 DIAGNOSIS — O10013 Pre-existing essential hypertension complicating pregnancy, third trimester: Secondary | ICD-10-CM | POA: Diagnosis not present

## 2021-10-29 ENCOUNTER — Other Ambulatory Visit: Payer: Self-pay | Admitting: *Deleted

## 2021-10-29 DIAGNOSIS — O30039 Twin pregnancy, monochorionic/diamniotic, unspecified trimester: Secondary | ICD-10-CM

## 2021-11-03 NOTE — Patient Instructions (Addendum)
Carrie Skinner  11/03/2021   Your procedure is scheduled on:  11/06/2021  Arrive at 3:15 pm at Entrance C on [address] [facility]  and [address]. You are invited to use the FREE valet parking or use the Visitor's parking deck.  Pick up the phone at the desk and dial [phone].  Call this number if you have problems the morning of surgery: [phone]  Remember:   Do not eat food::light meal until 9am nothing heavy nothing fried .  Do not drink clear liquids: water until noon   Take these medicines the morning of surgery with A SIP OF WATER:  none   Do not wear jewelry, make-up or nail polish.  Do not wear lotions, powders, or perfumes. Do not wear deodorant.  Do not shave 48 hours prior to surgery.  Do not bring valuables to the hospital.  [facility] is not   responsible for any belongings or valuables brought to the hospital.  Contacts, dentures or bridgework may not be worn into surgery.  Leave suitcase in the car. After surgery it may be brought to your room.  For patients admitted to the hospital, checkout time is 11:00 AM the day of              discharge.      Please read over the following fact sheets that you were given:     Preparing for Surgery

## 2021-11-04 ENCOUNTER — Other Ambulatory Visit: Payer: Self-pay | Admitting: Obstetrics and Gynecology

## 2021-11-04 ENCOUNTER — Ambulatory Visit: Payer: No Typology Code available for payment source | Admitting: *Deleted

## 2021-11-04 ENCOUNTER — Ambulatory Visit (HOSPITAL_BASED_OUTPATIENT_CLINIC_OR_DEPARTMENT_OTHER): Payer: No Typology Code available for payment source

## 2021-11-04 ENCOUNTER — Other Ambulatory Visit (HOSPITAL_COMMUNITY)
Admission: RE | Admit: 2021-11-04 | Discharge: 2021-11-04 | Disposition: A | Discharge status: Home / Self Care | Payer: No Typology Code available for payment source | Source: Ambulatory Visit | Attending: Obstetrics and Gynecology | Admitting: Obstetrics and Gynecology

## 2021-11-04 ENCOUNTER — Encounter: Payer: Self-pay | Admitting: *Deleted

## 2021-11-04 ENCOUNTER — Other Ambulatory Visit: Payer: Self-pay

## 2021-11-04 VITALS — BP 146/95 | HR 77

## 2021-11-04 DIAGNOSIS — F1910 Other psychoactive substance abuse, uncomplicated: Secondary | ICD-10-CM

## 2021-11-04 DIAGNOSIS — Z3A36 36 weeks gestation of pregnancy: Secondary | ICD-10-CM | POA: Insufficient documentation

## 2021-11-04 DIAGNOSIS — O10913 Unspecified pre-existing hypertension complicating pregnancy, third trimester: Secondary | ICD-10-CM | POA: Insufficient documentation

## 2021-11-04 DIAGNOSIS — Z01818 Encounter for other preprocedural examination: Secondary | ICD-10-CM

## 2021-11-04 DIAGNOSIS — O99213 Obesity complicating pregnancy, third trimester: Secondary | ICD-10-CM | POA: Insufficient documentation

## 2021-11-04 DIAGNOSIS — O30033 Twin pregnancy, monochorionic/diamniotic, third trimester: Secondary | ICD-10-CM

## 2021-11-04 DIAGNOSIS — E669 Obesity, unspecified: Secondary | ICD-10-CM

## 2021-11-04 DIAGNOSIS — O30039 Twin pregnancy, monochorionic/diamniotic, unspecified trimester: Secondary | ICD-10-CM

## 2021-11-04 DIAGNOSIS — O99323 Drug use complicating pregnancy, third trimester: Secondary | ICD-10-CM | POA: Diagnosis not present

## 2021-11-04 DIAGNOSIS — Z20822 Contact with and (suspected) exposure to covid-19: Secondary | ICD-10-CM | POA: Insufficient documentation

## 2021-11-04 DIAGNOSIS — Z01812 Encounter for preprocedural laboratory examination: Secondary | ICD-10-CM | POA: Insufficient documentation

## 2021-11-04 LAB — TYPE AND SCREEN
ABO/RH(D): O POS
Antibody Screen: NEGATIVE

## 2021-11-04 LAB — RPR: RPR Ser Ql: NONREACTIVE

## 2021-11-05 ENCOUNTER — Encounter (HOSPITAL_COMMUNITY): Payer: Self-pay | Admitting: Obstetrics and Gynecology

## 2021-11-05 LAB — SARS CORONAVIRUS 2 (TAT 6-24 HRS): SARS Coronavirus 2: NEGATIVE

## 2021-11-06 ENCOUNTER — Encounter (HOSPITAL_COMMUNITY)
Admission: RE | Disposition: A | Discharge status: Home / Self Care | Payer: Self-pay | Source: Home / Self Care | Attending: Obstetrics and Gynecology

## 2021-11-06 ENCOUNTER — Encounter (HOSPITAL_COMMUNITY): Payer: Self-pay | Admitting: Obstetrics and Gynecology

## 2021-11-06 ENCOUNTER — Inpatient Hospital Stay (HOSPITAL_COMMUNITY): Payer: No Typology Code available for payment source | Admitting: Anesthesiology

## 2021-11-06 ENCOUNTER — Inpatient Hospital Stay (HOSPITAL_COMMUNITY)
Admission: RE | Admit: 2021-11-06 | Discharge: 2021-11-13 | DRG: 786 | Disposition: A | Discharge status: Home / Self Care | Payer: No Typology Code available for payment source | Attending: Obstetrics and Gynecology | Admitting: Obstetrics and Gynecology

## 2021-11-06 DIAGNOSIS — O99214 Obesity complicating childbirth: Secondary | ICD-10-CM | POA: Diagnosis present

## 2021-11-06 DIAGNOSIS — O321XX2 Maternal care for breech presentation, fetus 2: Secondary | ICD-10-CM | POA: Diagnosis present

## 2021-11-06 DIAGNOSIS — Z3A36 36 weeks gestation of pregnancy: Secondary | ICD-10-CM | POA: Diagnosis not present

## 2021-11-06 DIAGNOSIS — O1002 Pre-existing essential hypertension complicating childbirth: Secondary | ICD-10-CM | POA: Diagnosis present

## 2021-11-06 DIAGNOSIS — O9942 Diseases of the circulatory system complicating childbirth: Secondary | ICD-10-CM | POA: Diagnosis present

## 2021-11-06 DIAGNOSIS — O114 Pre-existing hypertension with pre-eclampsia, complicating childbirth: Secondary | ICD-10-CM | POA: Diagnosis present

## 2021-11-06 DIAGNOSIS — I10 Essential (primary) hypertension: Secondary | ICD-10-CM | POA: Diagnosis not present

## 2021-11-06 DIAGNOSIS — E871 Hypo-osmolality and hyponatremia: Secondary | ICD-10-CM | POA: Diagnosis present

## 2021-11-06 DIAGNOSIS — O30033 Twin pregnancy, monochorionic/diamniotic, third trimester: Secondary | ICD-10-CM | POA: Diagnosis present

## 2021-11-06 DIAGNOSIS — I517 Cardiomegaly: Secondary | ICD-10-CM | POA: Diagnosis present

## 2021-11-06 DIAGNOSIS — O329XX2 Maternal care for malpresentation of fetus, unspecified, fetus 2: Secondary | ICD-10-CM | POA: Diagnosis present

## 2021-11-06 DIAGNOSIS — Z01818 Encounter for other preprocedural examination: Secondary | ICD-10-CM

## 2021-11-06 DIAGNOSIS — E669 Obesity, unspecified: Secondary | ICD-10-CM | POA: Diagnosis not present

## 2021-11-06 HISTORY — DX: Essential (primary) hypertension: I10

## 2021-11-06 LAB — COMPREHENSIVE METABOLIC PANEL
ALT: 26 U/L (ref 0–44)
AST: 24 U/L (ref 15–41)
Albumin: 3.1 g/dL — ABNORMAL LOW (ref 3.5–5.0)
Alkaline Phosphatase: 103 U/L (ref 38–126)
Anion gap: 10 (ref 5–15)
BUN: <5 mg/dL — ABNORMAL LOW (ref 6–20)
CO2: 19 mmol/L — ABNORMAL LOW (ref 22–32)
Calcium: 9 mg/dL (ref 8.9–10.3)
Chloride: 106 mmol/L (ref 98–111)
Creatinine, Ser: 0.51 mg/dL (ref 0.44–1.00)
GFR, Estimated: >60 mL/min (ref >60)
Glucose, Bld: 78 mg/dL (ref 70–99)
Potassium: 3.8 mmol/L (ref 3.5–5.1)
Sodium: 135 mmol/L (ref 135–145)
Total Bilirubin: 0.9 mg/dL (ref 0.3–1.2)
Total Protein: 6.3 g/dL — ABNORMAL LOW (ref 6.5–8.1)

## 2021-11-06 LAB — CBC
HCT: 30.6 % — ABNORMAL LOW (ref 36.0–46.0)
Hemoglobin: 10.2 g/dL — ABNORMAL LOW (ref 12.0–15.0)
MCH: 28.4 pg (ref 26.0–34.0)
MCHC: 33.3 g/dL (ref 30.0–36.0)
MCV: 85.2 fL (ref 80.0–100.0)
Platelets: 186 10*3/uL (ref 150–400)
RBC: 3.59 MIL/uL — ABNORMAL LOW (ref 3.87–5.11)
RDW: 13.7 % (ref 11.5–15.5)
WBC: 11 10*3/uL — ABNORMAL HIGH (ref 4.0–10.5)
nRBC: 0 % (ref 0.0–0.2)

## 2021-11-06 LAB — PROTEIN / CREATININE RATIO, URINE
Creatinine, Urine: 70.9 mg/dL
Protein Creatinine Ratio: 0.28 mg/mg{Cre} — ABNORMAL HIGH (ref 0.00–0.15)
Total Protein, Urine: 20 mg/dL

## 2021-11-06 SURGERY — Surgical Case
Anesthesia: Spinal | Wound class: Clean Contaminated

## 2021-11-06 MED ORDER — DEXAMETHASONE SODIUM PHOSPHATE 10 MG/ML IJ SOLN
INTRAMUSCULAR | Status: AC
  Filled 2021-11-06: qty 1

## 2021-11-06 MED ORDER — CEFAZOLIN SODIUM-DEXTROSE 2-4 GM/100ML-% IV SOLN
2.0000 g | INTRAVENOUS | Status: AC
  Administered 2021-11-06: 2 g via INTRAVENOUS

## 2021-11-06 MED ORDER — LABETALOL HCL 5 MG/ML IV SOLN: 20.0000 mg | INTRAVENOUS | Status: DC | PRN

## 2021-11-06 MED ORDER — SIMETHICONE 80 MG PO CHEW
80.0000 mg | CHEWABLE_TABLET | Freq: Three times a day (TID) | ORAL | Status: DC
  Administered 2021-11-07 – 2021-11-13 (×21): 80 mg via ORAL
  Filled 2021-11-06 (×21): qty 1

## 2021-11-06 MED ORDER — NALOXONE HCL 0.4 MG/ML IJ SOLN: 0.4000 mg | INTRAMUSCULAR | Status: DC | PRN

## 2021-11-06 MED ORDER — NALOXONE HCL 4 MG/10ML IJ SOLN
1.0000 ug/kg/h | INTRAVENOUS | Status: DC | PRN
  Filled 2021-11-06: qty 5

## 2021-11-06 MED ORDER — MORPHINE SULFATE (PF) 0.5 MG/ML IJ SOLN
INTRAMUSCULAR | Status: AC
  Filled 2021-11-06: qty 10

## 2021-11-06 MED ORDER — ONDANSETRON HCL 4 MG/2ML IJ SOLN
INTRAMUSCULAR | Status: AC
  Filled 2021-11-06: qty 2

## 2021-11-06 MED ORDER — SODIUM CHLORIDE 0.9 % IR SOLN
Status: DC | PRN
  Administered 2021-11-06: 1000 mL

## 2021-11-06 MED ORDER — LABETALOL HCL 5 MG/ML IV SOLN
80.0000 mg | INTRAVENOUS | Status: DC | PRN
  Filled 2021-11-06 (×2): qty 16

## 2021-11-06 MED ORDER — KETOROLAC TROMETHAMINE 30 MG/ML IJ SOLN
INTRAMUSCULAR | Status: AC
  Filled 2021-11-06: qty 1

## 2021-11-06 MED ORDER — OXYTOCIN-SODIUM CHLORIDE 30-0.9 UT/500ML-% IV SOLN
INTRAVENOUS | Status: DC | PRN
  Administered 2021-11-06: 400 mL via INTRAVENOUS

## 2021-11-06 MED ORDER — MENTHOL 3 MG MT LOZG
1.0000 | LOZENGE | OROMUCOSAL | Status: DC | PRN
  Filled 2021-11-06: qty 9

## 2021-11-06 MED ORDER — SENNOSIDES-DOCUSATE SODIUM 8.6-50 MG PO TABS
2.0000 | ORAL_TABLET | ORAL | Status: DC
  Administered 2021-11-06 – 2021-11-12 (×7): 2 via ORAL
  Filled 2021-11-06 (×8): qty 2

## 2021-11-06 MED ORDER — DIPHENHYDRAMINE HCL 25 MG PO CAPS: 25.0000 mg | ORAL_CAPSULE | Freq: Four times a day (QID) | ORAL | Status: DC | PRN

## 2021-11-06 MED ORDER — KETOROLAC TROMETHAMINE 30 MG/ML IJ SOLN: 30.0000 mg | Freq: Four times a day (QID) | INTRAMUSCULAR | Status: AC | PRN

## 2021-11-06 MED ORDER — HYDRALAZINE HCL 20 MG/ML IJ SOLN: 10.0000 mg | INTRAMUSCULAR | Status: DC | PRN

## 2021-11-06 MED ORDER — FENTANYL CITRATE (PF) 100 MCG/2ML IJ SOLN
INTRAMUSCULAR | Status: DC | PRN
  Administered 2021-11-06: 15 ug via INTRATHECAL

## 2021-11-06 MED ORDER — ACETAMINOPHEN 500 MG PO TABS
1000.0000 mg | ORAL_TABLET | Freq: Four times a day (QID) | ORAL | Status: DC
  Administered 2021-11-06 – 2021-11-13 (×24): 1000 mg via ORAL
  Filled 2021-11-06 (×26): qty 2

## 2021-11-06 MED ORDER — POVIDONE-IODINE 10 % EX SWAB
2.0000 "application " | Freq: Once | CUTANEOUS | Status: AC
  Administered 2021-11-06: 2 via TOPICAL

## 2021-11-06 MED ORDER — STERILE WATER FOR IRRIGATION IR SOLN
Status: DC | PRN
  Administered 2021-11-06: 1000 mL

## 2021-11-06 MED ORDER — LACTATED RINGERS IV SOLN: INTRAVENOUS | Status: DC

## 2021-11-06 MED ORDER — LABETALOL HCL 200 MG PO TABS
200.0000 mg | ORAL_TABLET | Freq: Two times a day (BID) | ORAL | Status: DC
  Administered 2021-11-06 – 2021-11-08 (×5): 200 mg via ORAL
  Filled 2021-11-06 (×5): qty 1

## 2021-11-06 MED ORDER — LABETALOL HCL 5 MG/ML IV SOLN
INTRAVENOUS | Status: AC
  Filled 2021-11-06: qty 4

## 2021-11-06 MED ORDER — BUPIVACAINE HCL (PF) 0.25 % IJ SOLN
INTRAMUSCULAR | Status: DC | PRN
  Administered 2021-11-06: 10 mL

## 2021-11-06 MED ORDER — PHENYLEPHRINE HCL-NACL 20-0.9 MG/250ML-% IV SOLN
INTRAVENOUS | Status: DC | PRN
  Administered 2021-11-06: 20 ug/min via INTRAVENOUS

## 2021-11-06 MED ORDER — SOD CITRATE-CITRIC ACID 500-334 MG/5ML PO SOLN
ORAL | Status: AC
  Filled 2021-11-06: qty 30

## 2021-11-06 MED ORDER — ZOLPIDEM TARTRATE 5 MG PO TABS: 5.0000 mg | ORAL_TABLET | Freq: Every evening | ORAL | Status: DC | PRN

## 2021-11-06 MED ORDER — OXYCODONE HCL 5 MG PO TABS
5.0000 mg | ORAL_TABLET | ORAL | Status: DC | PRN
  Administered 2021-11-08 – 2021-11-09 (×2): 5 mg via ORAL
  Filled 2021-11-06 (×2): qty 1

## 2021-11-06 MED ORDER — OXYTOCIN-SODIUM CHLORIDE 30-0.9 UT/500ML-% IV SOLN
INTRAVENOUS | Status: AC
  Filled 2021-11-06: qty 500

## 2021-11-06 MED ORDER — PRENATAL MULTIVITAMIN CH
1.0000 | ORAL_TABLET | Freq: Every day | ORAL | Status: DC
  Administered 2021-11-07 – 2021-11-13 (×7): 1 via ORAL
  Filled 2021-11-06 (×8): qty 1

## 2021-11-06 MED ORDER — LABETALOL HCL 5 MG/ML IV SOLN
40.0000 mg | INTRAVENOUS | Status: DC | PRN
  Administered 2021-11-06 – 2021-11-10 (×3): 40 mg via INTRAVENOUS
  Filled 2021-11-06 (×2): qty 8

## 2021-11-06 MED ORDER — SOD CITRATE-CITRIC ACID 500-334 MG/5ML PO SOLN: 30.0000 mL | ORAL | Status: DC

## 2021-11-06 MED ORDER — SIMETHICONE 80 MG PO CHEW
80.0000 mg | CHEWABLE_TABLET | ORAL | Status: DC | PRN
  Filled 2021-11-06: qty 1

## 2021-11-06 MED ORDER — COCONUT OIL OIL
1.0000 "application " | TOPICAL_OIL | Status: DC | PRN
  Administered 2021-11-09: 1 via TOPICAL

## 2021-11-06 MED ORDER — ACETAMINOPHEN 500 MG PO TABS: 1000.0000 mg | ORAL_TABLET | Freq: Four times a day (QID) | ORAL | Status: DC

## 2021-11-06 MED ORDER — KETOROLAC TROMETHAMINE 30 MG/ML IJ SOLN
30.0000 mg | Freq: Four times a day (QID) | INTRAMUSCULAR | Status: AC | PRN
  Administered 2021-11-06: 30 mg via INTRAVENOUS

## 2021-11-06 MED ORDER — MORPHINE SULFATE (PF) 0.5 MG/ML IJ SOLN
INTRAMUSCULAR | Status: DC | PRN
  Administered 2021-11-06: 150 ug via INTRATHECAL

## 2021-11-06 MED ORDER — OXYTOCIN-SODIUM CHLORIDE 30-0.9 UT/500ML-% IV SOLN: 2.5000 [IU]/h | INTRAVENOUS | Status: AC

## 2021-11-06 MED ORDER — ONDANSETRON HCL 4 MG/2ML IJ SOLN: 4.0000 mg | Freq: Three times a day (TID) | INTRAMUSCULAR | Status: DC | PRN

## 2021-11-06 MED ORDER — LABETALOL HCL 5 MG/ML IV SOLN: 80.0000 mg | INTRAVENOUS | Status: DC | PRN

## 2021-11-06 MED ORDER — ONDANSETRON HCL 4 MG/2ML IJ SOLN
INTRAMUSCULAR | Status: DC | PRN
  Administered 2021-11-06: 4 mg via INTRAVENOUS

## 2021-11-06 MED ORDER — SODIUM CHLORIDE 0.9% FLUSH: 3.0000 mL | INTRAVENOUS | Status: DC | PRN

## 2021-11-06 MED ORDER — IBUPROFEN 600 MG PO TABS
600.0000 mg | ORAL_TABLET | Freq: Four times a day (QID) | ORAL | Status: AC
  Administered 2021-11-07 – 2021-11-09 (×12): 600 mg via ORAL
  Filled 2021-11-06 (×12): qty 1

## 2021-11-06 MED ORDER — BUPIVACAINE IN DEXTROSE 0.75-8.25 % IT SOLN
INTRATHECAL | Status: DC | PRN
  Administered 2021-11-06: 1.6 mL via INTRATHECAL

## 2021-11-06 MED ORDER — MAGNESIUM SULFATE BOLUS VIA INFUSION
4.0000 g | Freq: Once | INTRAVENOUS | Status: AC
  Administered 2021-11-06: 4 g via INTRAVENOUS
  Filled 2021-11-06: qty 1000

## 2021-11-06 MED ORDER — LABETALOL HCL 5 MG/ML IV SOLN
20.0000 mg | INTRAVENOUS | Status: DC | PRN
  Administered 2021-11-06 – 2021-11-10 (×4): 20 mg via INTRAVENOUS
  Filled 2021-11-06 (×5): qty 4

## 2021-11-06 MED ORDER — DIPHENHYDRAMINE HCL 25 MG PO CAPS: 25.0000 mg | ORAL_CAPSULE | ORAL | Status: DC | PRN

## 2021-11-06 MED ORDER — PHENYLEPHRINE 40 MCG/ML (10ML) SYRINGE FOR IV PUSH (FOR BLOOD PRESSURE SUPPORT)
PREFILLED_SYRINGE | INTRAVENOUS | Status: AC
  Filled 2021-11-06: qty 10

## 2021-11-06 MED ORDER — DEXAMETHASONE SODIUM PHOSPHATE 10 MG/ML IJ SOLN
INTRAMUSCULAR | Status: DC | PRN
  Administered 2021-11-06: 10 mg via INTRAVENOUS

## 2021-11-06 MED ORDER — CEFAZOLIN SODIUM-DEXTROSE 2-4 GM/100ML-% IV SOLN
INTRAVENOUS | Status: AC
  Filled 2021-11-06: qty 100

## 2021-11-06 MED ORDER — PHENYLEPHRINE HCL-NACL 20-0.9 MG/250ML-% IV SOLN
INTRAVENOUS | Status: AC
  Filled 2021-11-06: qty 250

## 2021-11-06 MED ORDER — MAGNESIUM SULFATE 40 GM/1000ML IV SOLN
INTRAVENOUS | Status: AC
  Filled 2021-11-06: qty 1000

## 2021-11-06 MED ORDER — FENTANYL CITRATE (PF) 100 MCG/2ML IJ SOLN
INTRAMUSCULAR | Status: AC
  Filled 2021-11-06: qty 2

## 2021-11-06 MED ORDER — DIPHENHYDRAMINE HCL 50 MG/ML IJ SOLN: 12.5000 mg | INTRAMUSCULAR | Status: DC | PRN

## 2021-11-06 MED ORDER — BUPIVACAINE IN DEXTROSE 0.75-8.25 % IT SOLN: INTRATHECAL | Status: DC | PRN

## 2021-11-06 MED ORDER — BUPIVACAINE HCL (PF) 0.25 % IJ SOLN
INTRAMUSCULAR | Status: AC
  Filled 2021-11-06: qty 30

## 2021-11-06 MED ORDER — LABETALOL HCL 5 MG/ML IV SOLN
INTRAVENOUS | Status: AC
  Filled 2021-11-06: qty 8

## 2021-11-06 MED ORDER — LABETALOL HCL 5 MG/ML IV SOLN: 40.0000 mg | INTRAVENOUS | Status: DC | PRN

## 2021-11-06 MED ORDER — MAGNESIUM SULFATE 40 GM/1000ML IV SOLN
2.0000 g/h | INTRAVENOUS | Status: AC
  Administered 2021-11-06 – 2021-11-07 (×2): 2 g/h via INTRAVENOUS
  Filled 2021-11-06: qty 1000

## 2021-11-06 SURGICAL SUPPLY — 49 items
BARRIER ADHS 3X4 INTERCEED (GAUZE/BANDAGES/DRESSINGS) ×3 IMPLANT
BENZOIN TINCTURE PRP APPL 2/3 (GAUZE/BANDAGES/DRESSINGS) ×2 IMPLANT
CHLORAPREP W/TINT 26ML (MISCELLANEOUS) ×3 IMPLANT
CLAMP CORD UMBIL (MISCELLANEOUS) IMPLANT
CLOSURE WOUND 1/2 X4 (GAUZE/BANDAGES/DRESSINGS) ×1
CLOTH BEACON ORANGE TIMEOUT ST (SAFETY) ×3 IMPLANT
DRAPE C SECTION CLR SCREEN (DRAPES) ×3 IMPLANT
DRSG OPSITE POSTOP 4X10 (GAUZE/BANDAGES/DRESSINGS) ×3 IMPLANT
ELECT REM PT RETURN 9FT ADLT (ELECTROSURGICAL) ×3
ELECTRODE REM PT RTRN 9FT ADLT (ELECTROSURGICAL) ×1 IMPLANT
EXTRACTOR VACUUM M CUP 4 TUBE (SUCTIONS) IMPLANT
EXTRACTOR VACUUM M CUP 4' TUBE (SUCTIONS)
GLOVE BIOGEL PI IND STRL 7.0 (GLOVE) ×2 IMPLANT
GLOVE BIOGEL PI INDICATOR 7.0 (GLOVE) ×4
GLOVE ECLIPSE 6.5 STRL STRAW (GLOVE) ×3 IMPLANT
GOWN STRL REUS W/TWL LRG LVL3 (GOWN DISPOSABLE) ×6 IMPLANT
HOVERMATT SINGLE USE (MISCELLANEOUS) ×2 IMPLANT
KIT ABG SYR 3ML LUER SLIP (SYRINGE) IMPLANT
NDL HYPO 25X5/8 SAFETYGLIDE (NEEDLE) IMPLANT
NDL SAFETY ECLIPSE 18X1.5 (NEEDLE) IMPLANT
NEEDLE HYPO 18GX1.5 SHARP (NEEDLE) ×2
NEEDLE HYPO 22GX1.5 SAFETY (NEEDLE) ×5 IMPLANT
NEEDLE HYPO 25X5/8 SAFETYGLIDE (NEEDLE) IMPLANT
NS IRRIG 1000ML POUR BTL (IV SOLUTION) ×3 IMPLANT
PACK C SECTION WH (CUSTOM PROCEDURE TRAY) ×3 IMPLANT
PAD OB MATERNITY 4.3X12.25 (PERSONAL CARE ITEMS) ×3 IMPLANT
RETRACTOR TRAXI PANNICULUS (MISCELLANEOUS) IMPLANT
RTRCTR C-SECT PINK 25CM LRG (MISCELLANEOUS) ×2 IMPLANT
STRIP CLOSURE SKIN 1/2X4 (GAUZE/BANDAGES/DRESSINGS) ×1 IMPLANT
SUT CHROMIC GUT AB #0 18 (SUTURE) IMPLANT
SUT MNCRL 0 VIOLET CTX 36 (SUTURE) ×3 IMPLANT
SUT MON AB 2-0 SH 27 (SUTURE)
SUT MON AB 2-0 SH27 (SUTURE) IMPLANT
SUT MON AB 3-0 SH 27 (SUTURE)
SUT MON AB 3-0 SH27 (SUTURE) IMPLANT
SUT MON AB 4-0 PS1 27 (SUTURE) IMPLANT
SUT MONOCRYL 0 CTX 36 (SUTURE) ×6
SUT PLAIN 2 0 (SUTURE)
SUT PLAIN 2 0 XLH (SUTURE) IMPLANT
SUT PLAIN ABS 2-0 CT1 27XMFL (SUTURE) IMPLANT
SUT VIC AB 0 CT1 36 (SUTURE) ×6 IMPLANT
SUT VIC AB 2-0 CT1 27 (SUTURE) ×2
SUT VIC AB 2-0 CT1 TAPERPNT 27 (SUTURE) ×1 IMPLANT
SUT VIC AB 4-0 PS2 27 (SUTURE) IMPLANT
SYR CONTROL 10ML LL (SYRINGE) ×5 IMPLANT
TOWEL OR 17X24 6PK STRL BLUE (TOWEL DISPOSABLE) ×3 IMPLANT
TRAXI PANNICULUS RETRACTOR (MISCELLANEOUS) ×2
TRAY FOLEY W/BAG SLVR 14FR LF (SET/KITS/TRAYS/PACK) IMPLANT
WATER STERILE IRR 1000ML POUR (IV SOLUTION) ×3 IMPLANT

## 2021-11-06 NOTE — Lactation Note (Addendum)
This note was copied from a baby's chart. Lactation Consultation Note  Patient Name: Carrie Skinner ONGEX'Skinner Date: 11/06/2021 Reason for consult: Initial assessment;Mother's request;Difficult latch;Primapara;1st time breastfeeding;Late-preterm 34-36.6wks;Infant < 6lbs;Multiple gestation;Breastfeeding assistance;Other (Comment) (GHTN ( Mg sulftate)) Age:27 hours  LC reviewed LPTI guidelines to reduce calorie loss including keeping total feeding under 30 min.   Carrie Skinner Baby A Infant not able to latch at time of LC visit. Infant tongue sucking and not opening wide enough to get depth on breast.  Infant bottle fed Similac 22 cal/oz 11 ml during visit.  Plan 1. To feed  based on cues 8-12x 24hr period. Mom to offer breasts and look for signs of milk transfer in cross cradle prone.  2. Dad to supplement with EBM first followed by formula with extra slow flow nipple and pace bottle feeding 5-10 ml. Parents aware if infant not latching to offer more.  3. DEBP q 3hrs for 15 min  4. I and O sheet reviewed.  All questions answered at the end of the visit.    Carrie Skinner able to latch to breast but not able to get enough depth. Infant low glucose formula fed 22 cal/oz similac taking 11 ml.   Mom aware to call [facility] or RN for next feeding for latch assistance. Best latch in cross cradle prone to get more depth on breast.    Maternal Data Has patient been taught Hand Expression?: Yes Does the patient have breastfeeding experience prior to this delivery?: No  Feeding Mother's Current Feeding Choice: Breast Milk and Formula  LATCH Score                    Lactation Tools Discussed/Used Tools: Pump;Flanges;Bottle Flange Size: 21 Breast pump type: Double-Electric Breast Pump Pump Education: Setup, frequency, and cleaning;Milk Storage Reason for Pumping: increase stimulation Pumping frequency: every 3 hrs for 15 min  Interventions    Discharge Pump: Personal WIC Program:  No  Consult Status Consult Status: Follow-up Date: 11/07/21 Follow-up type: In-patient    Carrie Skinner  Carrie Skinner 11/06/2021, 10:49 PM

## 2021-11-06 NOTE — Anesthesia Procedure Notes (Addendum)
Spinal  Patient location during procedure: OR Start time: 11/06/2021 6:03 PM End time: 11/06/2021 6:08 PM Reason for block: surgical anesthesia Staffing Performed: anesthesiologist and other anesthesia staff  Anesthesiologist: Lannie Fields, DO Preanesthetic Checklist Completed: patient identified, IV checked, risks and benefits discussed, surgical consent, monitors and equipment checked, pre-op evaluation and timeout performed Spinal Block Patient position: sitting Prep: DuraPrep and site prepped and draped Patient monitoring: cardiac monitor, continuous pulse ox and blood pressure Approach: midline Location: L3-4 Injection technique: single-shot Needle Needle type: Pencan  Needle gauge: 24 G Needle length: 9 cm Assessment Sensory level: T6 Events: CSF return Additional Notes Functioning IV was confirmed and monitors were applied. Sterile prep and drape, including hand hygiene and sterile gloves were used. The patient was positioned and the spine was prepped. The skin was anesthetized with lidocaine.  Free flow of clear CSF was obtained prior to injecting local anesthetic into the CSF.  The spinal needle aspirated freely following injection.  The needle was carefully withdrawn.  The patient tolerated the procedure well.    Performed by sRNA under direct supervision.

## 2021-11-06 NOTE — Transfer of Care (Signed)
Immediate Anesthesia Transfer of Care Note  Patient: Carrie Skinner  Procedure(s) Performed: CESAREAN SECTION MULTI-GESTATIONAL  Patient Location: PACU  Anesthesia Type:Spinal  Level of Consciousness: awake  Airway & Oxygen Therapy: Patient Spontanous Breathing  Post-op Assessment: Report given to RN and Post -op Vital signs reviewed and stable  Post vital signs: Reviewed and stable  Last Vitals:  Vitals Value Taken Time  BP 159/93 11/06/21 1928  Temp    Pulse 65 11/06/21 1930  Resp 15 11/06/21 1930  SpO2 99 % 11/06/21 1930  Vitals shown include unvalidated device data.  Last Pain:  Vitals:   11/06/21 1547  TempSrc: Oral  PainSc:          Complications: No notable events documented.

## 2021-11-06 NOTE — Anesthesia Preprocedure Evaluation (Addendum)
Anesthesia Evaluation  Patient identified by MRN, date of birth, ID band Patient awake    Reviewed: Allergy & Precautions, NPO status , Patient's Chart, lab work & pertinent test results, reviewed documented beta blocker date and time   Airway Mallampati: II  TM Distance: >3 FB Neck ROM: Full    Dental no notable dental hx.    Pulmonary neg pulmonary ROS,    Pulmonary exam normal breath sounds clear to auscultation       Cardiovascular hypertension, Pt. on home beta blockers and Pt. on medications Normal cardiovascular exam Rhythm:Regular Rate:Normal     Neuro/Psych negative neurological ROS  negative psych ROS   GI/Hepatic negative GI ROS, Neg liver ROS,   Endo/Other  Morbid obesity  Renal/GU negative Renal ROS  negative genitourinary   Musculoskeletal negative musculoskeletal ROS (+)   Abdominal   Peds negative pediatric ROS (+)  Hematology negative hematology ROS (+)   Anesthesia Other Findings   Reproductive/Obstetrics (+) Pregnancy                            Anesthesia Physical Anesthesia Plan  ASA: 3  Anesthesia Plan: Spinal   Post-op Pain Management:    Induction:   PONV Risk Score and Plan: 2 and Treatment may vary due to age or medical condition  Airway Management Planned: Natural Airway  Additional Equipment: None  Intra-op Plan:   Post-operative Plan:   Informed Consent: I have reviewed the patients History and Physical, chart, labs and discussed the procedure including the risks, benefits and alternatives for the proposed anesthesia with the patient or authorized representative who has indicated his/her understanding and acceptance.     Dental advisory given  Plan Discussed with: CRNA and Anesthesiologist  Anesthesia Plan Comments: (Spinal. GETA as backup)       Anesthesia Quick Evaluation

## 2021-11-06 NOTE — Anesthesia Postprocedure Evaluation (Signed)
Anesthesia Post Note  Patient: Carrie Skinner  Procedure(s) Performed: CESAREAN SECTION MULTI-GESTATIONAL     Patient location during evaluation: PACU Anesthesia Type: Spinal Level of consciousness: awake and alert and oriented Pain management: pain level controlled Vital Signs Assessment: post-procedure vital signs reviewed and stable Respiratory status: spontaneous breathing, nonlabored ventilation and respiratory function stable Cardiovascular status: blood pressure returned to baseline and stable Postop Assessment: no headache, no backache, spinal receding, patient able to bend at knees and no apparent nausea or vomiting Anesthetic complications: no Comments: Hypertensive throughout entire operative time- started on magnesium in PACU    No notable events documented.  Last Vitals:  Vitals:   11/06/21 1930 11/06/21 1945  BP: (!) 160/94 (!) 163/100  Pulse: 65 69  Resp: 15 18  Temp: 36.8 C   SpO2: 99% 100%    Last Pain:  Vitals:   11/06/21 1945  TempSrc:   PainSc: 4    Pain Goal:    LLE Motor Response: Purposeful movement (11/06/21 1945) LLE Sensation: Tingling (11/06/21 1945) RLE Motor Response: Purposeful movement (11/06/21 1945) RLE Sensation: Tingling (11/06/21 1945)     Epidural/Spinal Function Cutaneous sensation: Tingles (11/06/21 1945), Patient able to flex knees: Yes (11/06/21 1945), Patient able to lift hips off bed: Yes (11/06/21 1945), Back pain beyond tenderness at insertion site: No (11/06/21 1945), Progressively worsening motor and/or sensory loss: No (11/06/21 1945), Bowel and/or bladder incontinence post epidural: No (11/06/21 1945)  Lannie Fields

## 2021-11-06 NOTE — Brief Op Note (Signed)
11/06/2021  7:19 PM  PATIENT:  Carrie Skinner  25 y.o. female  PRE-OPERATIVE DIAGNOSIS:  malpresentation 2nd twin, mono-di twins @ 36 2/7 wk, chronic HTN affecting pregnancy  POST-OPERATIVE DIAGNOSIS:  Malpresentation twin B, mono-di twins @ 36 2/7 wk, chronic HTN affecting pregnancy  PROCEDURE:  Primary Cesarean section, kerr hysteroscopy  SURGEON:  Surgeon(s) and Role:    * Maxie Better, MD - Primary  PHYSICIAN ASSISTANT:   ASSISTANTS: none   ANESTHESIA:   spinal FINDINGS: Live female x 2, vtx/incomplete breech, nl tubes , ovaries ( R>L). Placenta x 2 EBL:  530 mL   BLOOD ADMINISTERED:none  DRAINS: none   LOCAL MEDICATIONS USED:  MARCAINE     SPECIMEN:  Source of Specimen:  placenta  DISPOSITION OF SPECIMEN:  PATHOLOGY  COUNTS:  YES  TOURNIQUET:  * No tourniquets in log *  DICTATION: .Other Dictation: Dictation Number [id number]  PLAN OF CARE: Admit to inpatient   PATIENT DISPOSITION:  PACU - hemodynamically stable.   Delay start of Pharmacological VTE agent (>24hrs) due to surgical blood loss or risk of bleeding: no

## 2021-11-06 NOTE — H&P (Addendum)
Carrie Skinner is a 25 y.o. female presenting @36  2/7 for primary C/S due to malpresentation 2nd twin with mono-di twin gestation. Hx notable for chronic HTN. OB History     Gravida  2   Para      Term      Preterm      AB  1   Living         SAB  0   IAB  1   Ectopic      Multiple      Live Births             Past Medical History:  Diagnosis Date   Hypertension    UTI (lower urinary tract infection)    Past Surgical History:  Procedure Laterality Date   TONSILLECTOMY     Family History: family history includes Arthritis in an other family member; Breast cancer in an other family member; Diabetes in an other family member; Hypertension in an other family member; Stroke in her cousin. Social History:  reports that she has never smoked. She has never used smokeless tobacco. She reports that she does not currently use alcohol after a past usage of about 2.0 standard drinks per week. She reports that she does not use drugs.     Maternal Diabetes: No Genetic Screening: Normal Maternal Ultrasounds/Referrals: Normal Fetal Ultrasounds or other Referrals:  Fetal echo. Tr TR on twin B. MFM Maternal Substance Abuse:  No Significant Maternal Medications:  Meds include: Other: labetalol, baby ASA Significant Maternal Lab Results:  Group B Strep negative Other Comments:   mono-di twins BMZ complete 34wk Review of Systems  All other systems reviewed and are negative. History   Blood pressure (!) 161/100, pulse 74, temperature 99.1 F (37.3 C), temperature source Oral, resp. rate 18, height 5\' 5"  (1.651 m), weight 115.7 kg, last menstrual period 02/25/2021. Exam Physical Exam Constitutional:      Appearance: Normal appearance.  HENT:     Mouth/Throat:     Mouth: Mucous membranes are dry.  Cardiovascular:     Rate and Rhythm: Regular rhythm.     Heart sounds: Normal heart sounds.  Pulmonary:     Breath sounds: Normal breath sounds.  Abdominal:     Comments:  gravid  Musculoskeletal:     Cervical back: Neck supple.  Skin:    General: Skin is warm and dry.  Neurological:     General: No focal deficit present.     Mental Status: She is alert and oriented to person, place, and time.  Psychiatric:        Mood and Affect: Mood normal.        Behavior: Behavior normal.    Prenatal labs: ABO, Rh: --/--/O POS (12/13 1012) Antibody: NEG (12/13 1012) Rubella:  Immune RPR: NON REACTIVE (12/13 1012)  HBsAg:   neg HIV:   neg GBS:   negative  Assessment/Plan: Malpresentation 2nd twin Mono-dt twin Chronic HTN affecting preg IUP @ 36 2/7 wk P) Primary C/S. Procedure reviewed. Risk of surgery includes infection, bleeding, poss need for blood transfusion and it risk. Injury to bladder, bowel ureter, internal scar tissue. All ? answered   Ciarra Braddy A Dennis Hegeman 11/06/2021, 5:51 PM

## 2021-11-06 NOTE — Progress Notes (Addendum)
Pre-surgery BP noted Patient Vitals for the past 24 hrs:  BP Temp Temp src Pulse Resp Height Weight  11/06/21 1730 (!) 161/100 -- -- -- -- -- --  11/06/21 1715 (!) 153/96 -- -- -- -- -- --  11/06/21 1709 (!) 165/102 -- -- -- -- -- --  11/06/21 1700 (!) 175/112 -- -- -- -- -- --  11/06/21 1645 (!) 165/96 -- -- 74 18 -- --  11/06/21 1630 (!) 152/101 -- -- 80 18 -- --  11/06/21 1615 (!) 167/103 -- -- 78 18 -- --  11/06/21 1601 (!) 155/105 -- -- 79 18 -- --  11/06/21 1547 (!) 177/109 99.1 F (37.3 C) Oral 73 18 -- --  11/06/21 1539 -- -- -- -- -- 5\' 5"  (1.651 m) 115.7 kg  11/06/21 1530 (!) 153/109 -- -- -- 18 -- --   Known chronic HTN on labetalol previously controlled throughout pregnancy Pt has a lot of anxiety regarding surgery which I think is contributing IV labetalol given PIH labs done CBC Latest Ref Rng & Units 11/06/2021 06/28/2019  WBC 4.0 - 10.5 K/uL 11.0(H) 8.9  Hemoglobin 12.0 - 15.0 g/dL 10.2(L) 14.3  Hematocrit 36.0 - 46.0 % 30.6(L) 42.1  Platelets 150 - 400 K/uL 186 254.0    CMP Latest Ref Rng & Units 11/06/2021 06/28/2019 02/09/2017  Glucose 70 - 99 mg/dL 78 73 02/11/2017)  BUN 6 - 20 mg/dL 295(M) 14 11  Creatinine 0.44 - 1.00 mg/dL <8(U 1.32 4.40  Sodium 135 - 145 mmol/L 135 135 136  Potassium 3.5 - 5.1 mmol/L 3.8 3.9 3.9  Chloride 98 - 111 mmol/L 106 102 104  CO2 22 - 32 mmol/L 19(L) 25 25  Calcium 8.9 - 10.3 mg/dL 9.0 9.7 9.4  Total Protein 6.5 - 8.1 g/dL 6.3(L) 7.4 7.3  Total Bilirubin 0.3 - 1.2 mg/dL 0.9 0.5 0.3  Alkaline Phos 38 - 126 U/L 103 48 44  AST 15 - 41 U/L 24 16 14   ALT 0 - 44 U/L 26 14 13    PCR urine 0.28 Will increase her oral labetalol and reassess pt after surgery If still severe range BP, then will initiate magnesium sulfate      Addendum BP (!) 163/100 (BP Location: Right Arm)    Pulse 69    Temp 98.3 F (36.8 C)    Resp 18    Ht 5\' 5"  (1.651 m)    Wt 115.7 kg    LMP 02/25/2021    SpO2 100%    Breastfeeding Unknown    BMI 42.45 kg/m  Will  start on magnesium sulfate

## 2021-11-07 ENCOUNTER — Encounter (HOSPITAL_COMMUNITY): Payer: Self-pay | Admitting: Obstetrics and Gynecology

## 2021-11-07 LAB — COMPREHENSIVE METABOLIC PANEL
ALT: 20 U/L (ref 0–44)
AST: 22 U/L (ref 15–41)
Albumin: 2.4 g/dL — ABNORMAL LOW (ref 3.5–5.0)
Alkaline Phosphatase: 85 U/L (ref 38–126)
Anion gap: 7 (ref 5–15)
BUN: 6 mg/dL (ref 6–20)
CO2: 21 mmol/L — ABNORMAL LOW (ref 22–32)
Calcium: 7.9 mg/dL — ABNORMAL LOW (ref 8.9–10.3)
Chloride: 104 mmol/L (ref 98–111)
Creatinine, Ser: 0.63 mg/dL (ref 0.44–1.00)
GFR, Estimated: >60 mL/min (ref >60)
Glucose, Bld: 95 mg/dL (ref 70–99)
Potassium: 3.6 mmol/L (ref 3.5–5.1)
Sodium: 132 mmol/L — ABNORMAL LOW (ref 135–145)
Total Bilirubin: 0.5 mg/dL (ref 0.3–1.2)
Total Protein: 5.2 g/dL — ABNORMAL LOW (ref 6.5–8.1)

## 2021-11-07 LAB — CBC
HCT: 30.9 % — ABNORMAL LOW (ref 36.0–46.0)
HCT: 28.2 % — ABNORMAL LOW (ref 36.0–46.0)
Hemoglobin: 10.5 g/dL — ABNORMAL LOW (ref 12.0–15.0)
Hemoglobin: 9.6 g/dL — ABNORMAL LOW (ref 12.0–15.0)
MCH: 28.5 pg (ref 26.0–34.0)
MCH: 28.6 pg (ref 26.0–34.0)
MCHC: 34 g/dL (ref 30.0–36.0)
MCHC: 34 g/dL (ref 30.0–36.0)
MCV: 84 fL (ref 80.0–100.0)
MCV: 83.9 fL (ref 80.0–100.0)
Platelets: 197 10*3/uL (ref 150–400)
Platelets: 212 10*3/uL (ref 150–400)
RBC: 3.68 MIL/uL — ABNORMAL LOW (ref 3.87–5.11)
RBC: 3.36 MIL/uL — ABNORMAL LOW (ref 3.87–5.11)
RDW: 13.5 % (ref 11.5–15.5)
RDW: 13.8 % (ref 11.5–15.5)
WBC: 15 10*3/uL — ABNORMAL HIGH (ref 4.0–10.5)
WBC: 16.4 10*3/uL — ABNORMAL HIGH (ref 4.0–10.5)
nRBC: 0 % (ref 0.0–0.2)
nRBC: 0 % (ref 0.0–0.2)

## 2021-11-07 LAB — RPR: RPR Ser Ql: NONREACTIVE

## 2021-11-07 LAB — MAGNESIUM: Magnesium: 3.8 mg/dL — ABNORMAL HIGH (ref 1.7–2.4)

## 2021-11-07 MED ORDER — MAGNESIUM SULFATE 2 GM/50ML IV SOLN
2.0000 g | Freq: Once | INTRAVENOUS | Status: DC
  Filled 2021-11-07: qty 50

## 2021-11-07 MED ORDER — MAGNESIUM SULFATE BOLUS VIA INFUSION
2.0000 g | Freq: Once | INTRAVENOUS | Status: AC
  Administered 2021-11-07: 2 g via INTRAVENOUS

## 2021-11-07 NOTE — Progress Notes (Signed)
NT assisted pt up to bathroom to perform foley care. NT called out for RN at 1110 stating that the pt was about to pass out. RN entered room and assisted NT to lower pt to the floor. Pt passed out x2 and IV was dislodged during the incident. Pt easily aroused with ammonia. Pt was transferred back to bed via stedy. VSS. Mag infusion was paused at 1110 and was restarted at 1128. Notified Dr. Cherly Hensen of incident. Received verbal order for stat CBC and CMP.

## 2021-11-07 NOTE — Progress Notes (Signed)
SVD: primary  S:  Pt reports feeling better. Ambulate since earlier episode of passing out/ Tolerating po/ Voiding without problems/ No n/v/ Bleeding is moderate/ Pain controlled withprescription NSAID's including ibuprofen (Motrin) and narcotic analgesics including oxycodone/acetaminophen (Percocet, Tylox) Babies in NICU   O:  A & O x 3 / VS: Blood pressure 132/84, pulse 73, temperature 97.7 F (36.5 C), temperature source Oral, resp. rate 18, height 5\' 5"  (1.651 m), weight 115.7 kg, last menstrual period 02/25/2021, SpO2 98 %, unknown if currently breastfeeding.  LABS:  Results for orders placed or performed during the hospital encounter of 11/06/21 (from the past 24 hour(s))  CBC     Status: Abnormal   Collection Time: 11/07/21  1:27 AM  Result Value Ref Range   WBC 15.0 (H) 4.0 - 10.5 K/uL   RBC 3.68 (L) 3.87 - 5.11 MIL/uL   Hemoglobin 10.5 (L) 12.0 - 15.0 g/dL   HCT 30.9 (L) 36.0 - 46.0 %   MCV 84.0 80.0 - 100.0 fL   MCH 28.5 26.0 - 34.0 pg   MCHC 34.0 30.0 - 36.0 g/dL   RDW 13.5 11.5 - 15.5 %   Platelets 197 150 - 400 K/uL   nRBC 0.0 0.0 - 0.2 %  Magnesium     Status: Abnormal   Collection Time: 11/07/21  1:27 AM  Result Value Ref Range   Magnesium 3.8 (H) 1.7 - 2.4 mg/dL  CBC     Status: Abnormal   Collection Time: 11/07/21  1:11 PM  Result Value Ref Range   WBC 16.4 (H) 4.0 - 10.5 K/uL   RBC 3.36 (L) 3.87 - 5.11 MIL/uL   Hemoglobin 9.6 (L) 12.0 - 15.0 g/dL   HCT 28.2 (L) 36.0 - 46.0 %   MCV 83.9 80.0 - 100.0 fL   MCH 28.6 26.0 - 34.0 pg   MCHC 34.0 30.0 - 36.0 g/dL   RDW 13.8 11.5 - 15.5 %   Platelets 212 150 - 400 K/uL   nRBC 0.0 0.0 - 0.2 %  Comprehensive metabolic panel     Status: Abnormal   Collection Time: 11/07/21  1:11 PM  Result Value Ref Range   Sodium 132 (L) 135 - 145 mmol/L   Potassium 3.6 3.5 - 5.1 mmol/L   Chloride 104 98 - 111 mmol/L   CO2 21 (L) 22 - 32 mmol/L   Glucose, Bld 95 70 - 99 mg/dL   BUN 6 6 - 20 mg/dL   Creatinine, Ser 0.63 0.44 -  1.00 mg/dL   Calcium 7.9 (L) 8.9 - 10.3 mg/dL   Total Protein 5.2 (L) 6.5 - 8.1 g/dL   Albumin 2.4 (L) 3.5 - 5.0 g/dL   AST 22 15 - 41 U/L   ALT 20 0 - 44 U/L   Alkaline Phosphatase 85 38 - 126 U/L   Total Bilirubin 0.5 0.3 - 1.2 mg/dL   GFR, Estimated >60 >60 mL/min   Anion gap 7 5 - 15   CBC Latest Ref Rng & Units 11/07/2021 11/07/2021 11/06/2021  WBC 4.0 - 10.5 K/uL 16.4(H) 15.0(H) 11.0(H)  Hemoglobin 12.0 - 15.0 g/dL 9.6(L) 10.5(L) 10.2(L)  Hematocrit 36.0 - 46.0 % 28.2(L) 30.9(L) 30.6(L)  Platelets 150 - 400 K/uL 212 197 186     I&O: I/O last 3 completed shifts: In: 3034.6 [P.O.:240; I.V.:2694.6; IV Piggyback:100] Out: [id number] [Urine:2550; Blood:580]   Total I/O In: 1330.4 [I.V.:1330.4] Out: 2675 [Urine:2675]  Lungs: chest clear, no wheezing, rales, normal symmetric air entry  Heart:  regular rate and rhythm, S1, S2 normal, no murmur, click, rub or gallop  Abdomen: soft uterus firm at umb surgical tender Incision: honeycomb dressing blood stained in middle. intact  Perineum: not inspected  Lochia: light  Extremities:no redness or tenderness in the calves or thighs, edema tr+    A/P: POD # 1/PPD # 1/ [id number] Chronic HTn with severe features on magnesium sulfate. Suboptimal magnesium level managed with 2g bolus Hyponatremia related to magnesium. Will replete with oral fluid intake  Doing well  Continue routine post partum orders  Complete 24 hr magnesium

## 2021-11-07 NOTE — Op Note (Signed)
Carrie Skinner, KONEN MEDICAL RECORD NO: [id number] ACCOUNT NO: [id number] DATE OF BIRTH: 1996/04/08 FACILITY: [facility] LOCATION: [facility] PHYSICIAN: Ravinder Hofland A. Cherly Hensen, MD  Operative Report   DATE OF PROCEDURE: 11/06/2021  PREOPERATIVE DIAGNOSES:  Malpresentation second twin, monochorionic diamniotic twin gestation at 18 and 2/7 weeks, chronic hypertension.  PROCEDURES:  Primary cesarean section, Kerr hysterotomy.  POSTOPERATIVE DIAGNOSES:  Malpresentation second twin, monochorionic diamniotic twin gestation at 71 and 2/7 weeks, chronic hypertension.  ANESTHESIA:  Spinal.  SURGEON:  Orest Dygert A. Cherly Hensen, MD  ASSISTANT:  None.  DESCRIPTION OF PROCEDURE:  Under adequate spinal anesthesia, the patient was placed in the supine position with a left lateral tilt.  She was sterilely prepped and draped in the usual fashion.  An indwelling Foley catheter was sterilely placed.  0.25%  Marcaine was injected along the planned Pfannenstiel skin incision site.  Pfannenstiel skin incision was then made, carried down to the rectus fascia.  Rectus fascia opened transversely.  The rectus fascia was then bluntly and sharply dissected off the rectus muscles in a superior and inferior fashion.  The rectus muscle was split in the midline.  The parietal peritoneum was entered bluntly and extended.  A self-retaining Alexis retractor was then placed.  Vesicouterine peritoneum was opened transversely.  The bladder was displaced inferiorly with sharp dissection.  A small curvilinear uterine incision was then made and extended bluntly in the cephalic and caudad manner.  Clear amniotic sac was noted, which was artificially ruptured for  clear amniotic fluid.  Subsequent delivery of a live female on the maternal right from a vertex position was accomplished.  Delayed cord clamping was done on twin A.  Twin B was incomplete breech with the sac still intact.  The leg was grasped, stabilized and the baby was delivered using  the usual breech maneuvers and twin B was also a live female.  The cord was delayed clamped x 1 minute for both and cord was subsequently clamped and the babies were both transferred to the awaiting  pediatricians with Apgars of 8 and 9 at 1 and 5 minutes respectively for both twins.  Placenta was spontaneous, intact, sent to pathology.  Uterine cavity was cleaned of debris.  Uterine incision had no extension.  Uterine incision was closed in two  layers, the first layer was 0 Monocryl running locked stitch and second layer was imbricated using 0 Monocryl suture.  Normal tubes and ovaries were noted bilaterally.  The right ovary was larger than the left, but otherwise unremarkable.  The abdomen  was irrigated, suctioned of debris.  Interceed was placed in inverted T fashion overlying the incision.  The Alexis retractor was then removed.  The parietal peritoneum was closed with 2-0 Vicryl.  The rectus fascia was closed with 0 Vicryl x2.  The subcutaneous area was irrigated, small bleeders cauterized and interrupted 2-0 plain sutures were placed. The skin approximated using 4-0 Vicryl subcuticular closure.  SPECIMEN:  Placenta x2 sent to Pathology.  ESTIMATED BLOOD LOSS:  530 mL.  INTRAOPERATIVE FLUIDS:  2 liters.  URINE OUTPUT:  150 mL.  COUNTS:  Sponge and instrument counts x2 was correct.  COMPLICATIONS:  None.  DISPOSITION:  The patient tolerated the procedure well and was transferred to recovery room in stable condition.   NIK D: 11/06/2021 7:35:12 pm T: 11/07/2021 1:15:00 am  JOB: [id number]/ [id number]

## 2021-11-08 ENCOUNTER — Other Ambulatory Visit: Payer: Self-pay

## 2021-11-08 MED ORDER — NIFEDIPINE ER OSMOTIC RELEASE 30 MG PO TB24
30.0000 mg | ORAL_TABLET | Freq: Every day | ORAL | Status: DC
  Administered 2021-11-08 – 2021-11-10 (×3): 30 mg via ORAL
  Filled 2021-11-08 (×3): qty 1

## 2021-11-08 NOTE — Lactation Note (Signed)
This note was copied from a baby's chart. Lactation Consultation Note  Patient Name: Icey Tello [id number] Date: 11/08/2021 Reason for consult: Follow-up assessment;1st time breastfeeding;NICU baby;Multiple gestation Age:27 hours  Lactation followed up with Ms. Tenny, her support person (spouse) and two family members. We discussed breast pumping, and I answered questions about milk storage and her pumping schedule.  Ms. Dacey has noticed colostrum expressed better by hand than with her breast pump. I recommended that she pump 8 times a day and to HE several times a day to stimulate milk production and to provide colostrum via oral care to her babies.   Ms. Rivkin attempted to latch babies prior to NICU admission. Babies are now ad lib feeding, and she would like to breastfeed. Ms. Hodak is not sure if she will need lactation today, but she would like assistance tomorrow with latching. I recommended that she notify her RN on the NICU to contact lactation when ready for assistance.  Maternal Data Has patient been taught Hand Expression?: Yes Does the patient have breastfeeding experience prior to this delivery?: No  Feeding Mother's Current Feeding Choice: Breast Milk and Formula Nipple Type: Nfant Slow Flow (purple)  Lactation Tools Discussed/Used Tools: Pump Breast pump type: Double-Electric Breast Pump Pump Education: Setup, frequency, and cleaning;Milk Storage Reason for Pumping: NICU Pumping frequency: rec q3 hours Pumped volume: 0 mL  Interventions  Education; DEBP review  Discharge Pump: Personal;DEBP (Spectra S1 at home)  Consult Status Consult Status: Follow-up Date: 11/08/21 Follow-up type: In-patient    Walker Shadow 11/08/2021, 2:37 PM

## 2021-11-08 NOTE — Progress Notes (Signed)
SVD: primary  S:  Pt reports feeling well.  Denies h/a, visual changes . Saw babies in NICU/ Tolerating po/ Voiding without problems/ No n/v/ Bleeding is light/ Pain controlled withprescription NSAID's including motrin and narcotic analgesics including roxicet    O:  A & O x 3 / VS: Blood pressure (!) 146/108, pulse 80, temperature 98 F (36.7 C), temperature source Oral, resp. rate 17, height 5\' 5"  (1.651 m), weight 115.7 kg, last menstrual period 02/25/2021, SpO2 99 %, unknown if currently breastfeeding.  LABS:  Results for orders placed or performed during the hospital encounter of 11/06/21 (from the past 24 hour(s))  CBC     Status: Abnormal   Collection Time: 11/07/21  1:11 PM  Result Value Ref Range   WBC 16.4 (H) 4.0 - 10.5 K/uL   RBC 3.36 (L) 3.87 - 5.11 MIL/uL   Hemoglobin 9.6 (L) 12.0 - 15.0 g/dL   HCT 11/09/21 (L) 10.0 - 71.2 %   MCV 83.9 80.0 - 100.0 fL   MCH 28.6 26.0 - 34.0 pg   MCHC 34.0 30.0 - 36.0 g/dL   RDW 19.7 58.8 - 32.5 %   Platelets 212 150 - 400 K/uL   nRBC 0.0 0.0 - 0.2 %  Comprehensive metabolic panel     Status: Abnormal   Collection Time: 11/07/21  1:11 PM  Result Value Ref Range   Sodium 132 (L) 135 - 145 mmol/L   Potassium 3.6 3.5 - 5.1 mmol/L   Chloride 104 98 - 111 mmol/L   CO2 21 (L) 22 - 32 mmol/L   Glucose, Bld 95 70 - 99 mg/dL   BUN 6 6 - 20 mg/dL   Creatinine, Ser 11/09/21 0.44 - 1.00 mg/dL   Calcium 7.9 (L) 8.9 - 10.3 mg/dL   Total Protein 5.2 (L) 6.5 - 8.1 g/dL   Albumin 2.4 (L) 3.5 - 5.0 g/dL   AST 22 15 - 41 U/L   ALT 20 0 - 44 U/L   Alkaline Phosphatase 85 38 - 126 U/L   Total Bilirubin 0.5 0.3 - 1.2 mg/dL   GFR, Estimated 2.64 >15 mL/min   Anion gap 7 5 - 15    I&O: I/O last 3 completed shifts: In: 3120.1 [P.O.:720; I.V.:2400.1] Out: 7300 [Urine:6720; Blood:580]   No intake/output data recorded.  Lungs: chest clear, no wheezing, rales, normal symmetric air entry  Heart: regular rate and rhythm, S1, S2 normal, no murmur, click, rub or  gallop  Abdomen: soft uterus 1FB above umb primary dressing d/c/I. Honeycomb intact  Perineum: not inspected  Lochia: light  Extremities:no redness or tenderness in the calves or thighs, no edema    A/P: POD # 2/PPD # 2/ >83 s/p Primary C/S Chronic HTN on labetalol.  Doing ok  Continue routine post partum orders  Will add procardia XL

## 2021-11-09 ENCOUNTER — Encounter (HOSPITAL_COMMUNITY): Payer: Self-pay | Admitting: Obstetrics and Gynecology

## 2021-11-09 MED ORDER — LABETALOL HCL 200 MG PO TABS
300.0000 mg | ORAL_TABLET | Freq: Two times a day (BID) | ORAL | Status: DC
  Administered 2021-11-09 – 2021-11-12 (×7): 300 mg via ORAL
  Filled 2021-11-09 (×7): qty 1

## 2021-11-09 MED ORDER — LABETALOL HCL 200 MG PO TABS: 300.0000 mg | ORAL_TABLET | Freq: Two times a day (BID) | ORAL | Status: DC

## 2021-11-09 MED ORDER — HYDRALAZINE HCL 20 MG/ML IJ SOLN
5.0000 mg | Freq: Once | INTRAMUSCULAR | Status: AC
  Administered 2021-11-09: 23:00:00 5 mg via INTRAVENOUS
  Filled 2021-11-09: qty 1

## 2021-11-09 NOTE — Lactation Note (Signed)
This note was copied from a baby's chart. Lactation Consultation Note  Patient Name: Carrie Skinner [id number] Date: 11/09/2021 Reason for consult: Follow-up assessment;Multiple gestation Age:27 hours  LC in to room for follow up. Mother states she has not pumped since babies were transferred to her room. Reinforced the importance of pumping for stimulation and supplementation. Reviewed feeding goals with parents. Mother was fitted with 21-mm flange for pump but has a different brand at home. Talked about LPTI volume guidelines according to age. Per parents, babies are having good output and just finished feeding: [name]: [name] transferred 38 mL of formula with Nfant purple nipple   [name]: [name] transferred 30 mL of formula with yellow nipple Per SLP, both babies can use Nfant purple nipple.  LC promoted: 1-Feeding no longer than 30 minutes to preserve energy 2-Pumping using initiation setting for stimulation 3-Following SLP recommendations when bottle-feeding 4-Maternal hydration, nutrition and rest  All questions answered at this time. Contact LC for support.   Feeding Mother's Current Feeding Choice: Breast Milk and Formula  Lactation Tools Discussed/Used Tools: Pump;Flanges Flange Size: 21 Breast pump type: Double-Electric Breast Pump;Manual Reason for Pumping: stimulation and supplementation Pumping frequency: encouraged every 3h  Interventions Interventions: Breast feeding basics reviewed;Expressed milk;DEBP;Pace feeding;Education  Discharge Pump: DEBP;Personal;Manual  Consult Status Consult Status: Follow-up Date: 11/10/21 Follow-up type: In-patient    Tryce Surratt A Higuera Ancidey 11/09/2021, 3:32 PM

## 2021-11-09 NOTE — Progress Notes (Signed)
SVD: primary  S:  Pt reports feeling well/ Tolerating po/ Voiding without problems/ No n/v/ Bleeding is light/ Pain controlled withprescription NSAID's including ibuprofen (Motrin) and narcotic analgesics including oxycodone/acetaminophen (Percocet, Tylox)    O:  A & O x 3 / VS: Blood pressure (!) 152/95, pulse 74, temperature 98 F (36.7 C), temperature source Oral, resp. rate 18, height 5\' 5"  (1.651 m), weight 115.7 kg, last menstrual period 02/25/2021, SpO2 100 %, unknown if currently breastfeeding.  LABS: No results found for this or any previous visit (from the past 24 hour(s)).  I&O: I/O last 3 completed shifts: In: 2825 [P.O.:2700; I.V.:125] Out: 3545 [Urine:3545]   No intake/output data recorded.  Lungs: chest clear, no wheezing, rales, normal symmetric air entry  Heart: regular rate and rhythm, S1, S2 normal, no murmur, click, rub or gallop  Abdomen: soft obese uterus @ umb firm surgical tender  Perineum: not inspected  Lochia: just changed  Extremities:no redness or tenderness in the calves or thighs, edema tr+    A/P: POD # 3/PPD # 3/ 2826 Chronic HTN on labetalol/procardia XL  Doing well  Continue routine post partum orders  Will increase dose of labetalol Await disposition of twins regarding discharge

## 2021-11-10 ENCOUNTER — Inpatient Hospital Stay (HOSPITAL_COMMUNITY): Payer: No Typology Code available for payment source

## 2021-11-10 DIAGNOSIS — I10 Essential (primary) hypertension: Secondary | ICD-10-CM

## 2021-11-10 DIAGNOSIS — E669 Obesity, unspecified: Secondary | ICD-10-CM

## 2021-11-10 LAB — COMPREHENSIVE METABOLIC PANEL
ALT: 36 U/L (ref 0–44)
AST: 37 U/L (ref 15–41)
Albumin: 3 g/dL — ABNORMAL LOW (ref 3.5–5.0)
Alkaline Phosphatase: 87 U/L (ref 38–126)
Anion gap: 9 (ref 5–15)
BUN: 8 mg/dL (ref 6–20)
CO2: 22 mmol/L (ref 22–32)
Calcium: 9 mg/dL (ref 8.9–10.3)
Chloride: 104 mmol/L (ref 98–111)
Creatinine, Ser: 0.64 mg/dL (ref 0.44–1.00)
GFR, Estimated: >60 mL/min (ref >60)
Glucose, Bld: 87 mg/dL (ref 70–99)
Potassium: 3.6 mmol/L (ref 3.5–5.1)
Sodium: 135 mmol/L (ref 135–145)
Total Bilirubin: 0.7 mg/dL (ref 0.3–1.2)
Total Protein: 6.2 g/dL — ABNORMAL LOW (ref 6.5–8.1)

## 2021-11-10 LAB — CBC
HCT: 31.1 % — ABNORMAL LOW (ref 36.0–46.0)
Hemoglobin: 10.1 g/dL — ABNORMAL LOW (ref 12.0–15.0)
MCH: 27.8 pg (ref 26.0–34.0)
MCHC: 32.5 g/dL (ref 30.0–36.0)
MCV: 85.7 fL (ref 80.0–100.0)
Platelets: 231 10*3/uL (ref 150–400)
RBC: 3.63 MIL/uL — ABNORMAL LOW (ref 3.87–5.11)
RDW: 14.3 % (ref 11.5–15.5)
WBC: 11.5 10*3/uL — ABNORMAL HIGH (ref 4.0–10.5)
nRBC: 0 % (ref 0.0–0.2)

## 2021-11-10 LAB — ECHOCARDIOGRAM COMPLETE
Area-P 1/2: 4.08 cm2
Height: 65 in
S' Lateral: 3.3 cm
Weight: 4081.6 oz

## 2021-11-10 LAB — PROTEIN / CREATININE RATIO, URINE
Creatinine, Urine: 128.14 mg/dL
Protein Creatinine Ratio: 0.2 mg/mg{Cre} — ABNORMAL HIGH (ref 0.00–0.15)
Total Protein, Urine: 26 mg/dL

## 2021-11-10 LAB — BRAIN NATRIURETIC PEPTIDE: B Natriuretic Peptide: 21 pg/mL (ref 0.0–100.0)

## 2021-11-10 MED ORDER — NIFEDIPINE ER OSMOTIC RELEASE 60 MG PO TB24: 60.0000 mg | ORAL_TABLET | Freq: Every day | ORAL | Status: DC

## 2021-11-10 MED ORDER — FUROSEMIDE 10 MG/ML IJ SOLN
40.0000 mg | Freq: Once | INTRAMUSCULAR | Status: AC
  Administered 2021-11-10: 11:00:00 40 mg via INTRAVENOUS
  Filled 2021-11-10: qty 4

## 2021-11-10 MED ORDER — NIFEDIPINE ER OSMOTIC RELEASE 60 MG PO TB24
60.0000 mg | ORAL_TABLET | Freq: Once | ORAL | Status: AC
  Administered 2021-11-10: 14:00:00 60 mg via ORAL
  Filled 2021-11-10: qty 1

## 2021-11-10 MED ORDER — FUROSEMIDE 10 MG/ML IJ SOLN
40.0000 mg | Freq: Once | INTRAMUSCULAR | Status: AC
  Administered 2021-11-11: 07:00:00 40 mg via INTRAVENOUS
  Filled 2021-11-10: qty 4

## 2021-11-10 NOTE — Lactation Note (Signed)
This note was copied from a baby's chart. Lactation Consultation Note  Patient Name: Carrie Skinner Date: 11/10/2021   Age:27 days LC reached out to RN, Marita Kansas Dillard to see if Mom plans to continue with breastfeeding. Mom mostly offering the twins bottles so far.  RN to check with Mom and let me know.   Maternal Data    Feeding Nipple Type: Nfant Slow Flow (purple)  LATCH Score                    Lactation Tools Discussed/Used    Interventions    Discharge    Consult Status      Francine Hannan  [name] 11/10/2021, 9:56 PM

## 2021-11-10 NOTE — Progress Notes (Signed)
SVD: primary  S:  Pt reports feeling well.  Denies h/a, visual changes or epigastric pain/ Tolerating po/ Voiding without problems/ No n/v/ Bleeding is light/ Pain controlled withprescription NSAID's including ibuprofen (Motrin) and narcotic analgesics including oxycodone/acetaminophen (Percocet, Tylox)  S/p magnesium sulfate  O:  A & O x 3 / VS: Blood pressure (!) 150/109, pulse 73, temperature 97.9 F (36.6 C), temperature source Oral, resp. rate 17, height 5\' 5"  (1.651 m), weight 115.7 kg, last menstrual period 02/25/2021, SpO2 99 %, unknown if currently breastfeeding. Patient Vitals for the past 24 hrs:  BP Temp Temp src Pulse Resp SpO2  11/10/21 0859 (!) 150/109 -- -- 73 -- --  11/10/21 0845 (!) 162/107 -- -- 71 -- --  11/10/21 0823 (!) 164/106 -- -- 81 -- --  11/10/21 0809 (!) 156/110 -- -- 81 -- --  11/10/21 0659 (!) 156/106 -- -- 88 -- --  11/10/21 0314 (!) 154/93 97.9 F (36.6 C) Oral 82 17 99 %  11/09/21 2200 (!) 178/104 -- -- 73 -- 97 %  11/09/21 2141 (!) 161/96 98 F (36.7 C) Oral 70 17 99 %  11/09/21 1730 (!) 148/103 -- -- 75 -- 100 %  11/09/21 1612 (!) 151/105 -- -- 71 -- --  11/09/21 1550 (!) 161/96 -- -- 73 -- --  11/09/21 1549 (!) 161/96 -- -- 73 -- --  11/09/21 1205 -- -- -- -- -- 99 %  11/09/21 1202 (!) 153/88 -- -- 72 18 --     LABS:  Results for orders placed or performed during the hospital encounter of 11/06/21 (from the past 24 hour(s))  CBC     Status: Abnormal   Collection Time: 11/10/21  8:58 AM  Result Value Ref Range   WBC 11.5 (H) 4.0 - 10.5 K/uL   RBC 3.63 (L) 3.87 - 5.11 MIL/uL   Hemoglobin 10.1 (L) 12.0 - 15.0 g/dL   HCT 11/12/21 (L) 93.9 - 03.0 %   MCV 85.7 80.0 - 100.0 fL   MCH 27.8 26.0 - 34.0 pg   MCHC 32.5 30.0 - 36.0 g/dL   RDW 09.2 33.0 - 07.6 %   Platelets 231 150 - 400 K/uL   nRBC 0.0 0.0 - 0.2 %    I&O: No intake/output data recorded.   No intake/output data recorded.  Lungs: chest clear, no wheezing, rales, normal symmetric air  entry, Heart exam - S1, S2 normal, no murmur, no gallop, rate regular  Heart: regular rate and rhythm, S1, S2 normal, no murmur, click, rub or gallop  Abdomen: soft obese. Uterus firm at umb. Surgical tender Dressing intact. (+) blood stained un changed  Perineum: not inspected  Lochia: light  Extremities:no redness or tenderness in the calves or thighs, edema tr+    A/P: POD # 4/PPD # 4/ [id number]  Chronic HTN with severe features s/p magnesium sulfate on procardia and labetalol given not controlled not a candidate for discharge   Continue routine post partum orders  Cardiology consult Repeat PIH labs

## 2021-11-10 NOTE — Progress Notes (Signed)
Echocardiogram 2D Echocardiogram has been performed.  Warren Lacy Karris Deangelo RDCS 11/10/2021, 2:14 PM

## 2021-11-10 NOTE — Consult Note (Signed)
Cardiology Consultation:   Patient ID: Carrie Skinner MRN: [id number]; DOB: 09-28-1996  Admit date: 11/06/2021 Date of Consult: 11/10/2021  PCP:  Glori Luis, MD   [facility] Providers Cardiologist:  None        Patient Profile:   Carrie Skinner is a 25 y.o. female with a hx of chronic hypertension who is being seen 11/10/2021 for the evaluation of accelerated postpartum hypertension at the request of Dr Maxie Better.  History of Present Illness:   Carrie Skinner has a medical history of hypertension, was treated with chronic hypertension in pregnancy, obesity recently delivered with twins.  During her pregnancy her blood pressure had been managed appropriately and not had any episodes of significant hypertension.  However since delivery her blood pressure remains elevated.  At this time cardiology has been consulted to evaluate the patient.   Past Medical History:  Diagnosis Date   Hypertension    UTI (lower urinary tract infection)     Past Surgical History:  Procedure Laterality Date   CESAREAN SECTION MULTI-GESTATIONAL N/A 11/06/2021   Procedure: CESAREAN SECTION MULTI-GESTATIONAL;  Surgeon: Maxie Better, MD;  Location: [facility] LD ORS;  Service: Obstetrics;  Laterality: N/A;   TONSILLECTOMY         Inpatient Medications: Scheduled Meds:  acetaminophen  1,000 mg Oral Q6H   labetalol  300 mg Oral BID   NIFEdipine  30 mg Oral Daily   prenatal multivitamin  1 tablet Oral Q1200   senna-docusate  2 tablet Oral Q24H   simethicone  80 mg Oral TID PC   Continuous Infusions:  lactated ringers Stopped (11/07/21 2000)   lactated ringers     naLOXone (NARCAN) adult infusion for PRURITIS     PRN Meds: coconut oil, diphenhydrAMINE **OR** diphenhydrAMINE, diphenhydrAMINE, labetalol **AND** labetalol **AND** labetalol **AND** hydrALAZINE **AND** Measure blood pressure, menthol-cetylpyridinium, naloxone **AND** sodium chloride flush, naLOXone (NARCAN) adult  infusion for PRURITIS, ondansetron (ZOFRAN) IV, oxyCODONE, simethicone, zolpidem  Allergies:   No Known Allergies  Social History:   Social History   Socioeconomic History   Marital status: Single    Spouse name: Not on file   Number of children: Not on file   Years of education: Not on file   Highest education level: Not on file  Occupational History   Not on file  Tobacco Use   Smoking status: Never   Smokeless tobacco: Never  Vaping Use   Vaping Use: Never used  Substance and Sexual Activity   Alcohol use: Not Currently    Alcohol/week: 2.0 standard drinks    Types: 2 Cans of beer per week   Drug use: No   Sexual activity: Yes    Partners: Male  Other Topics Concern   Not on file  Social History Narrative   Patient is a Archivist at [employer].   Social Determinants of Health   Financial Resource Strain: Not on file  Food Insecurity: Not on file  Transportation Needs: Not on file  Physical Activity: Not on file  Stress: Not on file  Social Connections: Not on file  Intimate Partner Violence: Not on file    Family History:    Family History  Problem Relation Age of Onset   Arthritis Other    Breast cancer Other    Hypertension Other    Diabetes Other    Stroke Cousin        Early 30s     ROS:  Please see the history of present illness.  All other ROS reviewed and negative.     Physical Exam/Data:   Vitals:   11/10/21 0809 11/10/21 0823 11/10/21 0845 11/10/21 0859  BP: (!) 156/110 (!) 164/106 (!) 162/107 (!) 150/109  Pulse: 81 81 71 73  Resp:      Temp:      TempSrc:      SpO2:      Weight:      Height:       No intake or output data in the 24 hours ending 11/10/21 1000 Last 3 Weights 11/06/2021 08/07/2019 06/28/2019  Weight (lbs) 255 lb 1.6 oz 222 lb 3.2 oz 223 lb  Weight (kg) 115.713 kg 100.789 kg 101.152 kg     Body mass index is 42.45 kg/m.  General:  Well nourished, well developed, in no acute distress HEENT: normal Neck:  no JVD Vascular: No carotid bruits; Distal pulses 2+ bilaterally Cardiac:  normal S1, S2; RRR; no murmur  Lungs:  clear to auscultation bilaterally, no wheezing, rhonchi or rales  Abd: soft, nontender, no hepatomegaly  Ext: no edema Musculoskeletal:  No deformities, BUE and BLE strength normal and equal Skin: warm and dry  Neuro:  CNs 2-12 intact, no focal abnormalities noted Psych:  Normal affect   EKG:  The EKG was personally reviewed and demonstrates:   Telemetry:  Telemetry was personally reviewed and demonstrates:    Relevant CV Studies: None   Laboratory Data:  High Sensitivity Troponin:  No results for input(s): TROPONINIHS in the last 720 hours.   Chemistry Recent Labs  Lab 11/06/21 1543 11/07/21 0127 11/07/21 1311  NA 135  --  132*  K 3.8  --  3.6  CL 106  --  104  CO2 19*  --  21*  GLUCOSE 78  --  95  BUN <5*  --  6  CREATININE 0.51  --  0.63  CALCIUM 9.0  --  7.9*  MG  --  3.8*  --   GFRNONAA >60  --  >60  ANIONGAP 10  --  7    Recent Labs  Lab 11/06/21 1543 11/07/21 1311  PROT 6.3* 5.2*  ALBUMIN 3.1* 2.4*  AST 24 22  ALT 26 20  ALKPHOS 103 85  BILITOT 0.9 0.5   Lipids No results for input(s): CHOL, TRIG, HDL, LABVLDL, LDLCALC, CHOLHDL in the last 168 hours.  Hematology Recent Labs  Lab 11/07/21 0127 11/07/21 1311 11/10/21 0858  WBC 15.0* 16.4* 11.5*  RBC 3.68* 3.36* 3.63*  HGB 10.5* 9.6* 10.1*  HCT 30.9* 28.2* 31.1*  MCV 84.0 83.9 85.7  MCH 28.5 28.6 27.8  MCHC 34.0 34.0 32.5  RDW 13.5 13.8 14.3  PLT 197 212 231   Thyroid No results for input(s): TSH, FREET4 in the last 168 hours.  BNPNo results for input(s): BNP, PROBNP in the last 168 hours.  DDimer No results for input(s): DDIMER in the last 168 hours.   Radiology/Studies:  No results found.   Assessment and Plan:   Chronic hypertension with accelerated postpartum hypertension Obesity  She is currently on labetalol 300 mg twice daily with Procardia 30 mg daily and her  blood pressure seems to be significantly elevated.    I would like for [place] to give the patient Lasix 40 mg one-time dose and IV especially given her significant diastolic hypertension to avoid any flash pulmonary edema.  In the meantime I am going to get an echocardiogram to assess for any diastolic dysfunction or any other valvular abnormalities.  She will also benefit from increasing her Procardia to 60 mg daily and uptitrate as appropriate.    She may need an another day or 2 of diuretics to help which once her blood pressure is improved and if she needs to be discharged tomorrow Lasix 40 mg x 2 doses can be given to the patient.  There is also an option of adding enalapril to her medical regimen but I will wait after the increase in Procardia and be cautious diuretics before considering adding the third agent.  As long as she is in the hospital will follow with the patient.  Her BMP is pending, will place BNP.  She also is a great candidate for referral to our cardio obstetrics clinic.  I have given her our information.  At time of discharge we will set the patient up with an appointment.  Risk Assessment/Risk Scores:          For questions or updates, please contact [facility] Please consult [email or web address] for contact info under    Signed, Olumide Dolinger, DO  11/10/2021 10:00 AM

## 2021-11-10 NOTE — Lactation Note (Addendum)
This note was copied from a baby's chart. Lactation Consultation Note  Patient Name: Corrinna Karapetyan PZWCH'E Date: 11/10/2021 Reason for consult: Follow-up assessment;Mother's request;Difficult latch;Late-preterm 34-36.6wks;Infant < 6lbs;Multiple gestation;Breastfeeding assistance Age:27 days  Mom asking for assistance getting on regular pumping schedule. LC increased flange size from 21 to 24 and Mom states comfortable fit and getting milk.   Mom feeding plan pumping and bottle feeding. Mom aware to offer eBM first followed by formula. Mom to increase volume as tolerated since infants are not latching at the breast at this time. PO advancement being monitored by SLP.   Mom following LPTI guidelines to keep total feeding under 30 min.  Mom to work on post pumping after each feeding using DEBP q 3hrs for 15 min.  All questions answered at the end of the visit.   Maternal Data    Feeding Mother's Current Feeding Choice: Breast Milk and Formula Nipple Type: Nfant Slow Flow (purple)  LATCH Score                    Lactation Tools Discussed/Used Tools: Pump;Flanges Flange Size: 24 Breast pump type: Double-Electric Breast Pump Pump Education: Setup, frequency, and cleaning;Milk Storage Reason for Pumping: increase stimulation Pumping frequency: every 3 hrs for 15 min  Interventions Interventions: Breast feeding basics reviewed;DEBP;Expressed milk;Education;Pace feeding;Visual merchandiser education  Discharge    Consult Status Consult Status: Follow-up Date: 11/11/21 Follow-up type: In-patient    Leeum Sankey  [name] 11/10/2021, 10:44 PM

## 2021-11-11 LAB — SURGICAL PATHOLOGY

## 2021-11-11 MED ORDER — NIFEDIPINE ER OSMOTIC RELEASE 60 MG PO TB24
90.0000 mg | ORAL_TABLET | Freq: Every day | ORAL | Status: DC
  Administered 2021-11-11 – 2021-11-13 (×3): 90 mg via ORAL
  Filled 2021-11-11 (×3): qty 1

## 2021-11-11 MED ORDER — ENALAPRIL MALEATE 5 MG PO TABS
5.0000 mg | ORAL_TABLET | Freq: Every day | ORAL | Status: DC
  Administered 2021-11-11: 15:00:00 5 mg via ORAL
  Filled 2021-11-11: qty 1

## 2021-11-11 NOTE — Progress Notes (Signed)
SVD: primary  S:  Pt reports feeling well denies any PIH warning signs. ? Regarding Echo results/ Tolerating po/ Voiding without problems/ No n/v/ Bleeding is light/ Pain controlled withprescription NSAID's including motrin and narcotic analgesics including oxycodone/acetaminophen (Percocet, Tylox)    O:  A & O x 3 / VS: Blood pressure (!) 150/94, pulse (!) 102, temperature 98.1 F (36.7 C), temperature source Oral, resp. rate 18, height 5\' 5"  (1.651 m), weight 115.7 kg, last menstrual period 02/25/2021, SpO2 100 %, unknown if currently breastfeeding.  LABS: No results found for this or any previous visit (from the past 24 hour(s)).  I&O: I/O last 3 completed shifts: In: -  Out: 5950 [Urine:5950]   Total I/O In: 650 [P.O.:650] Out: 1550 [Urine:1550]  Lungs: chest clear, no wheezing, rales, normal symmetric air entry  Heart: regular rate and rhythm, S1, S2 normal, no murmur, click, rub or gallop  Abdomen: soft uterus @ umb min tender  Dressing intact , stained  Perineum: not inspected  Lochia: light  Extremities:no redness or tenderness in the calves or thighs, no edema    A/P: POD # 5/PPD # 5/ 04/27/2021 s/p primary C/S  Chronic HTN  on labetalol, procardia s/p lasix x 2 seen by cardiologist Dr [id number] still needing control. Enalapril added  Doing well otherwise  Continue routine post partum orders  Cardiology consult appreciated

## 2021-11-11 NOTE — Progress Notes (Signed)
Progress Note  Patient Name: Carrie Skinner Date of Encounter: 11/11/2021  Primary Cardiologist: None   Subjective   Patient seen and examined at her bedside. Her husband and bedside nurse were both present. She offers no complaints. She has some questions about her echocardiogram which I was able to answer after reviewing her echo.  Inpatient Medications    Scheduled Meds:  acetaminophen  1,000 mg Oral Q6H   labetalol  300 mg Oral BID   NIFEdipine  90 mg Oral Daily   prenatal multivitamin  1 tablet Oral Q1200   senna-docusate  2 tablet Oral Q24H   simethicone  80 mg Oral TID PC   Continuous Infusions:  lactated ringers Stopped (11/07/21 2000)   lactated ringers     naLOXone (NARCAN) adult infusion for PRURITIS     PRN Meds: coconut oil, diphenhydrAMINE **OR** diphenhydrAMINE, diphenhydrAMINE, labetalol **AND** labetalol **AND** labetalol **AND** hydrALAZINE **AND** Measure blood pressure, menthol-cetylpyridinium, naloxone **AND** sodium chloride flush, naLOXone (NARCAN) adult infusion for PRURITIS, ondansetron (ZOFRAN) IV, oxyCODONE, simethicone, zolpidem   Vital Signs    Vitals:   11/11/21 0023 11/11/21 0336 11/11/21 0358 11/11/21 0804  BP: (!) 146/94 (!) 171/102 (!) 158/97 (!) 149/96  Pulse: 97 99 87 90  Resp: 20 18  18   Temp: 97.7 F (36.5 C) 97.9 F (36.6 C)  98.2 F (36.8 C)  TempSrc: Oral Oral  Oral  SpO2: 100% 99%  100%  Weight:      Height:        Intake/Output Summary (Last 24 hours) at 11/11/2021 11/13/2021 Last data filed at 11/11/2021 0804 Gross per 24 hour  Intake 650 ml  Output 6550 ml  Net -5900 ml   Filed Weights   11/06/21 1539  Weight: 115.7 kg    Telemetry    Not on tele - Personally Reviewed  ECG    None today - Personally Reviewed  Physical Exam    General: Comfortable,lying in the bed holding one of her babies. Head: Atraumatic, normal size  Eyes: PEERLA, EOMI  Neck: Supple, normal JVD Cardiac: Normal S1, S2; RRR; no  murmurs, rubs, or gallops Lungs: Clear to auscultation bilaterally Abd: Soft, nontender, no hepatomegaly  Ext: warm, no edema Musculoskeletal: No deformities, BUE and BLE strength normal and equal Skin: Warm and dry, no rashes   Neuro: Alert and oriented to person, place, time, and situation, CNII-XII grossly intact, no focal deficits  Psych: Normal mood and affect   Labs    Chemistry Recent Labs  Lab 11/06/21 1543 11/07/21 1311 11/10/21 0858  NA 135 132* 135  K 3.8 3.6 3.6  CL 106 104 104  CO2 19* 21* 22  GLUCOSE 78 95 87  BUN <5* 6 8  CREATININE 0.51 0.63 0.64  CALCIUM 9.0 7.9* 9.0  PROT 6.3* 5.2* 6.2*  ALBUMIN 3.1* 2.4* 3.0*  AST 24 22 37  ALT 26 20 36  ALKPHOS 103 85 87  BILITOT 0.9 0.5 0.7  GFRNONAA >60 >60 >60  ANIONGAP 10 7 9      Hematology Recent Labs  Lab 11/07/21 0127 11/07/21 1311 11/10/21 0858  WBC 15.0* 16.4* 11.5*  RBC 3.68* 3.36* 3.63*  HGB 10.5* 9.6* 10.1*  HCT 30.9* 28.2* 31.1*  MCV 84.0 83.9 85.7  MCH 28.5 28.6 27.8  MCHC 34.0 34.0 32.5  RDW 13.5 13.8 14.3  PLT 197 212 231    Cardiac EnzymesNo results for input(s): TROPONINI in the last 168 hours. No results for input(s): TROPIPOC in the  last 168 hours.   BNP Recent Labs  Lab 11/10/21 0858  BNP 21.0     DDimer No results for input(s): DDIMER in the last 168 hours.   Radiology    ECHOCARDIOGRAM COMPLETE  Result Date: 11/10/2021    ECHOCARDIOGRAM REPORT   Patient Name:   Carrie Skinner Date of Exam: 11/10/2021 Medical Rec #:  [id number]       Height:       65.0 in Accession #:    [id number]      Weight:       255.1 lb Date of Birth:  1996-04-01       BSA:          2.194 m Patient Age:    25 years        BP:           159/107 mmHg Patient Gender: F               HR:           78 bpm. Exam Location:  Inpatient Procedure: 2D Echo, Color Doppler and Cardiac Doppler Indications:    I10 Hypertension  History:        Patient has no prior history of Echocardiogram examinations.                  Risk Factors:Hypertension.  Sonographer:    Irving Burton Senior RDCS Referring Phys: [id number] Dewon Mendizabal  Sonographer Comments: 4 days post-partum (twins) IMPRESSIONS  1. Left ventricular ejection fraction, by estimation, is 55 to 60%. The left ventricle has normal function. The left ventricle has no regional wall motion abnormalities. There is moderate left ventricular hypertrophy. Left ventricular diastolic parameters were normal.  2. Right ventricular systolic function was not well visualized. The right ventricular size is not well visualized.  3. A small pericardial effusion is present.  4. The mitral valve is normal in structure. No evidence of mitral valve regurgitation. No evidence of mitral stenosis.  5. The aortic valve is tricuspid. Aortic valve regurgitation is not visualized. No aortic stenosis is present.  6. The inferior vena cava is normal in size with greater than 50% respiratory variability, suggesting right atrial pressure of 3 mmHg. FINDINGS  Left Ventricle: Left ventricular ejection fraction, by estimation, is 55 to 60%. The left ventricle has normal function. The left ventricle has no regional wall motion abnormalities. The left ventricular internal cavity size was normal in size. There is  moderate left ventricular hypertrophy. Left ventricular diastolic parameters were normal. Right Ventricle: The right ventricular size is not well visualized. Right vetricular wall thickness was not well visualized. Right ventricular systolic function was not well visualized. Left Atrium: Left atrial size was normal in size. Right Atrium: Right atrial size was normal in size. Pericardium: A small pericardial effusion is present. Mitral Valve: The mitral valve is normal in structure. No evidence of mitral valve regurgitation. No evidence of mitral valve stenosis. Tricuspid Valve: The tricuspid valve is normal in structure. Tricuspid valve regurgitation is trivial. Aortic Valve: The aortic valve is tricuspid. Aortic  valve regurgitation is not visualized. No aortic stenosis is present. Pulmonic Valve: The pulmonic valve was not well visualized. Pulmonic valve regurgitation is not visualized. Aorta: The aortic root and ascending aorta are structurally normal, with no evidence of dilitation. Venous: The inferior vena cava is normal in size with greater than 50% respiratory variability, suggesting right atrial pressure of 3 mmHg. IAS/Shunts: The interatrial septum was not well visualized.  LEFT  VENTRICLE PLAX 2D LVIDd:         4.50 cm   Diastology LVIDs:         3.30 cm   LV e' medial:    9.36 cm/s LV PW:         1.40 cm   LV E/e' medial:  7.0 LV IVS:        1.00 cm   LV e' lateral:   10.20 cm/s LVOT diam:     2.10 cm   LV E/e' lateral: 6.4 LV SV:         62 LV SV Index:   28 LVOT Area:     3.46 cm  RIGHT VENTRICLE RV S prime:     8.81 cm/s TAPSE (M-mode): 2.1 cm LEFT ATRIUM             Index        RIGHT ATRIUM           Index LA diam:        3.90 cm 1.78 cm/m   RA Area:     20.00 cm LA Vol (A2C):   48.6 ml 22.15 ml/m  RA Volume:   60.60 ml  27.62 ml/m LA Vol (A4C):   79.6 ml 36.29 ml/m LA Biplane Vol: 67.7 ml 30.86 ml/m  AORTIC VALVE LVOT Vmax:   94.60 cm/s LVOT Vmean:  72.500 cm/s LVOT VTI:    0.178 m  AORTA Ao Root diam: 2.90 cm Ao Asc diam:  2.80 cm MITRAL VALVE MV Area (PHT): 4.08 cm    SHUNTS MV Decel Time: 186 msec    Systemic VTI:  0.18 m MV E velocity: 65.60 cm/s  Systemic Diam: 2.10 cm MV A velocity: 47.10 cm/s MV E/A ratio:  1.39 Epifanio Lesches MD Electronically signed by Epifanio Lesches MD Signature Date/Time: 11/10/2021/2:35:24 PM    Final     Cardiac Studies   TTE 11/11/2021  1. Left ventricular ejection fraction, by estimation, is 55 to 60%. The  left ventricle has normal function. The left ventricle has no regional  wall motion abnormalities. There is moderate left ventricular hypertrophy.  Left ventricular diastolic  parameters were normal.   2. Right ventricular systolic function was  not well visualized. The right  ventricular size is not well visualized.   3. A small pericardial effusion is present.   4. The mitral valve is normal in structure. No evidence of mitral valve  regurgitation. No evidence of mitral stenosis.   5. The aortic valve is tricuspid. Aortic valve regurgitation is not  visualized. No aortic stenosis is present.   6. The inferior vena cava is normal in size with greater than 50%  respiratory variability, suggesting right atrial pressure of 3 mmHg.   FINDINGS   Left Ventricle: Left ventricular ejection fraction, by estimation, is 55  to 60%. The left ventricle has normal function. The left ventricle has no  regional wall motion abnormalities. The left ventricular internal cavity  size was normal in size. There is   moderate left ventricular hypertrophy. Left ventricular diastolic  parameters were normal.   Right Ventricle: The right ventricular size is not well visualized. Right  vetricular wall thickness was not well visualized. Right ventricular  systolic function was not well visualized.   Left Atrium: Left atrial size was normal in size.   Right Atrium: Right atrial size was normal in size.   Pericardium: A small pericardial effusion is present.   Mitral Valve: The mitral valve is normal  in structure. No evidence of  mitral valve regurgitation. No evidence of mitral valve stenosis.   Tricuspid Valve: The tricuspid valve is normal in structure. Tricuspid  valve regurgitation is trivial.   Aortic Valve: The aortic valve is tricuspid. Aortic valve regurgitation is  not visualized. No aortic stenosis is present.   Pulmonic Valve: The pulmonic valve was not well visualized. Pulmonic valve  regurgitation is not visualized.   Aorta: The aortic root and ascending aorta are structurally normal, with  no evidence of dilitation.   Venous: The inferior vena cava is normal in size with greater than 50%  respiratory variability, suggesting  right atrial pressure of 3 mmHg.   IAS/Shunts: The interatrial septum was not well visualized  Patient Profile     25 y.o. female with chronic hypertensin and now accelerated postpartum hypertension. Recent delivery with twin.   Assessment & Plan    Accelerated Postpartum Hypertension  Chronic hypertension  Moderate left ventricular hypertrophy obesity  Her blood pressure is improving but not yet to target. I will increase her Nifedipine to 90 mg starting this morning and continue the current labetalol dose. I suspect we may need to add enalapril but will wait to see her response to the recent dose increase.  She plans to use her breast milk ( bottle feed) so will need to be cautious with choice of additional antihypertensive.   She did benefit from the dose of IV lasix yesterday with total output at 5900 ml. Will give one more dose today. I do not anticipate she will need any other dosing but will clinically monitor.   Overall, she will need to be monitored for good blood pressure control even after her postpartum ( fourth trimester) period. Her chronic hypertension likely outside of pregnancy has not been well controlled because her echo shows evidence of end organ damage with moderate left ventricular hypertrophy. I spoke with her long term close hypertension follow up is important is this young lady.   The small pericardial effusion noted on echo is expected is pregnancy and early postpartum - no need for any further work up.  Lifestyle modification advised.   Will continue to follow along while inpatient, I will arrange follow up when out patient.      For questions or updates, please contact [facility] Please consult [email or web address] for contact info under Cardiology/STEMI.      Signed, Thomasene Ripple, DO  11/11/2021, 9:07 AM

## 2021-11-11 NOTE — Lactation Note (Signed)
This note was copied from a baby's chart. Lactation Consultation Note  Patient Name: Carrie Skinner [id number] Date: 11/11/2021 Reason for consult: Follow-up assessment;Primapara;1st time breastfeeding;Multiple gestation;Early term 37-38.6wks;Infant weight loss;Other (Comment) (As LC entered the room/ baby being fed by grandmother from the bottle) Age:27 days Baby A being fed by grandmother  Baby B being fed my mom  LC reviewed BF D/C teaching / and the importance of consistent pumping around the clock 8-10 times a day to establish and protect the milk supply.  LC reviewed BF D/C teaching . Mom has the [facility] O/P number and brochure for [facility] questions and to call back for [facility] O/P appt.   Maternal Data    Feeding Mother's Current Feeding Choice: Breast Milk and Formula Nipple Type: Extra Slow Flow  LATCH Score                    Lactation Tools Discussed/Used Tools: Pump;Flanges Flange Size: 24;Other (comment) (per mom feels better than the #21 Flange - milk coming in) Breast pump type: Double-Electric Breast Pump Pump Education: Milk Storage  Interventions Interventions: Breast feeding basics reviewed;DEBP;Education;LC Services brochure  Discharge Discharge Education: Engorgement and breast care;Warning signs for feeding baby;Outpatient recommendation (to call back after baby's are gaining steadily) Pump: Personal;DEBP;Manual  Consult Status Consult Status: Complete Date: 11/11/21    Kathrin Greathouse 11/11/2021, 11:27 AM

## 2021-11-12 MED ORDER — LABETALOL HCL 200 MG PO TABS
300.0000 mg | ORAL_TABLET | Freq: Three times a day (TID) | ORAL | Status: DC
  Administered 2021-11-12 – 2021-11-13 (×4): 300 mg via ORAL
  Filled 2021-11-12 (×5): qty 1

## 2021-11-12 MED ORDER — ENALAPRIL MALEATE 5 MG PO TABS
10.0000 mg | ORAL_TABLET | Freq: Two times a day (BID) | ORAL | Status: DC
  Administered 2021-11-12 (×2): 10 mg via ORAL
  Filled 2021-11-12 (×2): qty 2

## 2021-11-12 MED ORDER — FUROSEMIDE 10 MG/ML IJ SOLN
40.0000 mg | Freq: Once | INTRAMUSCULAR | Status: AC
  Administered 2021-11-12: 12:00:00 40 mg via INTRAVENOUS
  Filled 2021-11-12: qty 4

## 2021-11-12 NOTE — Progress Notes (Signed)
SVD: primary  S:  Pt reports feeling fine/ Tolerating po/ Voiding without problems/ No n/v/ Bleeding is light/ Pain controlled withprescription NSAID's including ibuprofen (Motrin) Disappointed in still being here due to blood pressures   O:  A & O x 3 / VS: Blood pressure (!) 158/91, pulse 86, temperature 98.2 F (36.8 C), temperature source Oral, resp. rate 18, height 5\' 5"  (1.651 m), weight 115.7 kg, last menstrual period 02/25/2021, SpO2 97 %, unknown if currently breastfeeding.  LABS: No results found for this or any previous visit (from the past 24 hour(s)).  I&O: I/O last 3 completed shifts: In: 650 [P.O.:650] Out: 6550 [Urine:6550]   Total I/O In: 960 [P.O.:960] Out: 1250 [Urine:1250]  Lungs: chest clear, no wheezing, rales, normal symmetric air entry  Heart: regular rate and rhythm, S1, S2 normal, no murmur, click, rub or gallop  Abdomen: soft non distended  incision: honeycomb dressing removed. Steristrip partially removed  Perineum: not inspected  Lochia: light  Extremities:no redness or tenderness in the calves or thighs, no edema    A/P: POD # 6/PPD # 04/27/2021  Chronic HTN ( severe) currently on multiple medications  P) change labetalol to TID dosing  Cont with plan from cardiology stand point Advised pt BP currently are not in the range to comfortably send her home Continue routine post partum orders

## 2021-11-12 NOTE — Progress Notes (Addendum)
Progress Note  Patient Name: Carrie Skinner Date of Encounter: 11/12/2021  Primary Cardiologist: None   Subjective   Patient seen and examined at her bedside. Her husband and bedside nurse were both present. She offers no complaints. She has some questions about her echocardiogram which I was able to answer after reviewing her echo.  Inpatient Medications    Scheduled Meds:  acetaminophen  1,000 mg Oral Q6H   enalapril  10 mg Oral BID   labetalol  300 mg Oral BID   NIFEdipine  90 mg Oral Daily   prenatal multivitamin  1 tablet Oral Q1200   senna-docusate  2 tablet Oral Q24H   simethicone  80 mg Oral TID PC   Continuous Infusions:  lactated ringers Stopped (11/07/21 2000)   lactated ringers     naLOXone (NARCAN) adult infusion for PRURITIS     PRN Meds: coconut oil, diphenhydrAMINE **OR** diphenhydrAMINE, diphenhydrAMINE, labetalol **AND** labetalol **AND** labetalol **AND** hydrALAZINE **AND** Measure blood pressure, menthol-cetylpyridinium, naloxone **AND** sodium chloride flush, naLOXone (NARCAN) adult infusion for PRURITIS, ondansetron (ZOFRAN) IV, oxyCODONE, simethicone, zolpidem   Vital Signs    Vitals:   11/12/21 0002 11/12/21 0438 11/12/21 0817 11/12/21 0824  BP: (!) 150/94 (!) 159/102 (!) 169/113 (!) 153/95  Pulse: 88 96 98 90  Resp: 18 18 18    Temp: 97.8 F (36.6 C) 97.7 F (36.5 C) 98.5 F (36.9 C)   TempSrc: Oral Oral Oral   SpO2: 100% 99% 99%   Weight:      Height:        Intake/Output Summary (Last 24 hours) at 11/12/2021 1120 Last data filed at 11/12/2021 1100 Gross per 24 hour  Intake --  Output 3000 ml  Net -3000 ml   Filed Weights   11/06/21 1539  Weight: 115.7 kg    Telemetry    Not on tele - Personally Reviewed  ECG    None today - Personally Reviewed  Physical Exam    General: Comfortable,lying in the bed holding one of her babies. Head: Atraumatic, normal size  Eyes: PEERLA, EOMI  Neck: Supple, normal JVD Cardiac:  Normal S1, S2; RRR; no murmurs, rubs, or gallops Lungs: Clear to auscultation bilaterally Abd: Soft, nontender, no hepatomegaly  Ext: warm, no edema Musculoskeletal: No deformities, BUE and BLE strength normal and equal Skin: Warm and dry, no rashes   Neuro: Alert and oriented to person, place, time, and situation, CNII-XII grossly intact, no focal deficits  Psych: Normal mood and affect   Labs    Chemistry Recent Labs  Lab 11/06/21 1543 11/07/21 1311 11/10/21 0858  NA 135 132* 135  K 3.8 3.6 3.6  CL 106 104 104  CO2 19* 21* 22  GLUCOSE 78 95 87  BUN <5* 6 8  CREATININE 0.51 0.63 0.64  CALCIUM 9.0 7.9* 9.0  PROT 6.3* 5.2* 6.2*  ALBUMIN 3.1* 2.4* 3.0*  AST 24 22 37  ALT 26 20 36  ALKPHOS 103 85 87  BILITOT 0.9 0.5 0.7  GFRNONAA >60 >60 >60  ANIONGAP 10 7 9      Hematology Recent Labs  Lab 11/07/21 0127 11/07/21 1311 11/10/21 0858  WBC 15.0* 16.4* 11.5*  RBC 3.68* 3.36* 3.63*  HGB 10.5* 9.6* 10.1*  HCT 30.9* 28.2* 31.1*  MCV 84.0 83.9 85.7  MCH 28.5 28.6 27.8  MCHC 34.0 34.0 32.5  RDW 13.5 13.8 14.3  PLT 197 212 231    Cardiac EnzymesNo results for input(s): TROPONINI in the last 168 hours.  No results for input(s): TROPIPOC in the last 168 hours.   BNP Recent Labs  Lab 11/10/21 0858  BNP 21.0     DDimer No results for input(s): DDIMER in the last 168 hours.   Radiology    ECHOCARDIOGRAM COMPLETE  Result Date: 11/10/2021    ECHOCARDIOGRAM REPORT   Patient Name:   Carrie Skinner Date of Exam: 11/10/2021 Medical Rec #:  [id number]       Height:       65.0 in Accession #:    [id number]      Weight:       255.1 lb Date of Birth:  [birth year]       BSA:          2.194 m Patient Age:    25 years        BP:           159/107 mmHg Patient Gender: F               HR:           78 bpm. Exam Location:  Inpatient Procedure: 2D Echo, Color Doppler and Cardiac Doppler Indications:    I10 Hypertension  History:        Patient has no prior history of Echocardiogram  examinations.                 Risk Factors:Hypertension.  Sonographer:    Irving Burton Senior RDCS Referring Phys: [id number] Merly Hinkson  Sonographer Comments: 4 days post-partum (twins) IMPRESSIONS  1. Left ventricular ejection fraction, by estimation, is 55 to 60%. The left ventricle has normal function. The left ventricle has no regional wall motion abnormalities. There is moderate left ventricular hypertrophy. Left ventricular diastolic parameters were normal.  2. Right ventricular systolic function was not well visualized. The right ventricular size is not well visualized.  3. A small pericardial effusion is present.  4. The mitral valve is normal in structure. No evidence of mitral valve regurgitation. No evidence of mitral stenosis.  5. The aortic valve is tricuspid. Aortic valve regurgitation is not visualized. No aortic stenosis is present.  6. The inferior vena cava is normal in size with greater than 50% respiratory variability, suggesting right atrial pressure of 3 mmHg. FINDINGS  Left Ventricle: Left ventricular ejection fraction, by estimation, is 55 to 60%. The left ventricle has normal function. The left ventricle has no regional wall motion abnormalities. The left ventricular internal cavity size was normal in size. There is  moderate left ventricular hypertrophy. Left ventricular diastolic parameters were normal. Right Ventricle: The right ventricular size is not well visualized. Right vetricular wall thickness was not well visualized. Right ventricular systolic function was not well visualized. Left Atrium: Left atrial size was normal in size. Right Atrium: Right atrial size was normal in size. Pericardium: A small pericardial effusion is present. Mitral Valve: The mitral valve is normal in structure. No evidence of mitral valve regurgitation. No evidence of mitral valve stenosis. Tricuspid Valve: The tricuspid valve is normal in structure. Tricuspid valve regurgitation is trivial. Aortic Valve: The aortic  valve is tricuspid. Aortic valve regurgitation is not visualized. No aortic stenosis is present. Pulmonic Valve: The pulmonic valve was not well visualized. Pulmonic valve regurgitation is not visualized. Aorta: The aortic root and ascending aorta are structurally normal, with no evidence of dilitation. Venous: The inferior vena cava is normal in size with greater than 50% respiratory variability, suggesting right atrial pressure of 3 mmHg. IAS/Shunts: The interatrial  septum was not well visualized.  LEFT VENTRICLE PLAX 2D LVIDd:         4.50 cm   Diastology LVIDs:         3.30 cm   LV e' medial:    9.36 cm/s LV PW:         1.40 cm   LV E/e' medial:  7.0 LV IVS:        1.00 cm   LV e' lateral:   10.20 cm/s LVOT diam:     2.10 cm   LV E/e' lateral: 6.4 LV SV:         62 LV SV Index:   28 LVOT Area:     3.46 cm  RIGHT VENTRICLE RV S prime:     8.81 cm/s TAPSE (M-mode): 2.1 cm LEFT ATRIUM             Index        RIGHT ATRIUM           Index LA diam:        3.90 cm 1.78 cm/m   RA Area:     20.00 cm LA Vol (A2C):   48.6 ml 22.15 ml/m  RA Volume:   60.60 ml  27.62 ml/m LA Vol (A4C):   79.6 ml 36.29 ml/m LA Biplane Vol: 67.7 ml 30.86 ml/m  AORTIC VALVE LVOT Vmax:   94.60 cm/s LVOT Vmean:  72.500 cm/s LVOT VTI:    0.178 m  AORTA Ao Root diam: 2.90 cm Ao Asc diam:  2.80 cm MITRAL VALVE MV Area (PHT): 4.08 cm    SHUNTS MV Decel Time: 186 msec    Systemic VTI:  0.18 m MV E velocity: 65.60 cm/s  Systemic Diam: 2.10 cm MV A velocity: 47.10 cm/s MV E/A ratio:  1.39 Epifanio Lesches MD Electronically signed by Epifanio Lesches MD Signature Date/Time: 11/10/2021/2:35:24 PM    Final     Cardiac Studies   TTE 11/11/2021  1. Left ventricular ejection fraction, by estimation, is 55 to 60%. The  left ventricle has normal function. The left ventricle has no regional  wall motion abnormalities. There is moderate left ventricular hypertrophy.  Left ventricular diastolic  parameters were normal.   2. Right  ventricular systolic function was not well visualized. The right  ventricular size is not well visualized.   3. A small pericardial effusion is present.   4. The mitral valve is normal in structure. No evidence of mitral valve  regurgitation. No evidence of mitral stenosis.   5. The aortic valve is tricuspid. Aortic valve regurgitation is not  visualized. No aortic stenosis is present.   6. The inferior vena cava is normal in size with greater than 50%  respiratory variability, suggesting right atrial pressure of 3 mmHg.   FINDINGS   Left Ventricle: Left ventricular ejection fraction, by estimation, is 55  to 60%. The left ventricle has normal function. The left ventricle has no  regional wall motion abnormalities. The left ventricular internal cavity  size was normal in size. There is   moderate left ventricular hypertrophy. Left ventricular diastolic  parameters were normal.   Right Ventricle: The right ventricular size is not well visualized. Right  vetricular wall thickness was not well visualized. Right ventricular  systolic function was not well visualized.   Left Atrium: Left atrial size was normal in size.   Right Atrium: Right atrial size was normal in size.   Pericardium: A small pericardial effusion is present.  Mitral Valve: The mitral valve is normal in structure. No evidence of  mitral valve regurgitation. No evidence of mitral valve stenosis.   Tricuspid Valve: The tricuspid valve is normal in structure. Tricuspid  valve regurgitation is trivial.   Aortic Valve: The aortic valve is tricuspid. Aortic valve regurgitation is  not visualized. No aortic stenosis is present.   Pulmonic Valve: The pulmonic valve was not well visualized. Pulmonic valve  regurgitation is not visualized.   Aorta: The aortic root and ascending aorta are structurally normal, with  no evidence of dilitation.   Venous: The inferior vena cava is normal in size with greater than 50%   respiratory variability, suggesting right atrial pressure of 3 mmHg.   IAS/Shunts: The interatrial septum was not well visualized  Patient Profile     25 y.o. female with chronic hypertensin and now accelerated postpartum hypertension. Recent delivery with twin.   Assessment & Plan    Accelerated Postpartum Hypertension  Chronic hypertension  Moderate left ventricular hypertrophy obesity  She has had a rebound compared to yesterday blood pressure is slightly higher.  I added enalapril 10 mg twice daily this is also safe for breast-feeding.  Please schedule the enalapril 10 AM and 10 PM.  We Skinner continue on her current labetalol dose 300 mg twice daily along with the nifedipine 90 mg daily.    If blood pressure does not go less than 140/90 mmHg use hydralazine 25 mg every 8 hours and titrate to blood pressure target.  She has had 2 doses of IV Lasix 40 mg in total over output -10,809 milliliters.  She has had a negative balance right.  She could benefit from 1 more dose.  I am avoiding long-term diuretic in this patient given the fact that she plans and is hoping to breast-feed if we must use diuretic okay for hydrochlorothiazide nothing more than 12.5 mg daily.  Due to her plan to breast milk ( bottle feed) Skinner need to be cautious with other choice of additional antihypertensive.   Overall, she Skinner need to be monitored for good blood pressure control even after her postpartum (fourth trimester) period. Her chronic hypertension likely outside of pregnancy has not been well controlled because her echo shows evidence of end organ damage with moderate left ventricular hypertrophy. I spoke with her long term close hypertension follow up is important is this young lady.   The small pericardial effusion noted on echo is expected is pregnancy and early postpartum - no need for any further work up.  Lifestyle modification advised.   I have her scheduled for a virtual visit on Tuesday,  December 27 at 11 AM at my [place] office.     For questions or updates, please contact [facility] Please consult [email or web address] for contact info under Cardiology/STEMI.      Signed, Keyton Bhat, DO  11/12/2021, 11:20 AM

## 2021-11-13 MED ORDER — NIFEDIPINE 10 MG PO CAPS: 20.0000 mg | ORAL_CAPSULE | ORAL | Status: DC | PRN

## 2021-11-13 MED ORDER — HYDRALAZINE HCL 50 MG PO TABS
25.0000 mg | ORAL_TABLET | Freq: Three times a day (TID) | ORAL | Status: DC
  Administered 2021-11-13: 14:00:00 25 mg via ORAL
  Filled 2021-11-13: qty 1

## 2021-11-13 MED ORDER — HYDRALAZINE HCL 25 MG PO TABS: 25.0000 mg | ORAL_TABLET | Freq: Three times a day (TID) | ORAL | 11 refills | Status: DC

## 2021-11-13 MED ORDER — LABETALOL HCL 300 MG PO TABS: 300.0000 mg | ORAL_TABLET | Freq: Three times a day (TID) | ORAL | 11 refills | Status: DC

## 2021-11-13 MED ORDER — NIFEDIPINE 10 MG PO CAPS
10.0000 mg | ORAL_CAPSULE | ORAL | Status: DC | PRN
  Administered 2021-11-13: 13:00:00 10 mg via ORAL
  Filled 2021-11-13: qty 1

## 2021-11-13 MED ORDER — LABETALOL HCL 5 MG/ML IV SOLN: 40.0000 mg | INTRAVENOUS | Status: DC | PRN

## 2021-11-13 MED ORDER — IBUPROFEN 800 MG PO TABS: 800.0000 mg | ORAL_TABLET | Freq: Three times a day (TID) | ORAL | 11 refills | Status: DC | PRN

## 2021-11-13 MED ORDER — ENALAPRIL MALEATE 20 MG PO TABS: 20.0000 mg | ORAL_TABLET | Freq: Two times a day (BID) | ORAL | 11 refills | Status: DC

## 2021-11-13 MED ORDER — ENALAPRIL MALEATE 5 MG PO TABS
20.0000 mg | ORAL_TABLET | Freq: Two times a day (BID) | ORAL | Status: DC
  Administered 2021-11-13: 10:00:00 20 mg via ORAL
  Filled 2021-11-13: qty 4

## 2021-11-13 MED ORDER — NIFEDIPINE ER OSMOTIC RELEASE 90 MG PO TB24: 90.0000 mg | ORAL_TABLET | Freq: Every day | ORAL | 11 refills | Status: DC

## 2021-11-13 NOTE — Discharge Instructions (Signed)
Call if temperature greater than equal to 100.4, nothing per vagina for 4-6 weeks or severe nausea vomiting, increased incisional pain , drainage or redness in the incision site, no straining with bowel movements, showers no bath °

## 2021-11-13 NOTE — Progress Notes (Signed)
Progress Note  Patient Name: Carrie Skinner Date of Encounter: 11/13/2021  Primary Cardiologist:   None   Subjective   Feels OK.  Breathing OK.  Anxious to go home.    Inpatient Medications    Scheduled Meds:  acetaminophen  1,000 mg Oral Q6H   enalapril  10 mg Oral BID   labetalol  300 mg Oral TID   NIFEdipine  90 mg Oral Daily   prenatal multivitamin  1 tablet Oral Q1200   senna-docusate  2 tablet Oral Q24H   simethicone  80 mg Oral TID PC   Continuous Infusions:  lactated ringers Stopped (11/07/21 2000)   lactated ringers     naLOXone (NARCAN) adult infusion for PRURITIS     PRN Meds: coconut oil, diphenhydrAMINE **OR** diphenhydrAMINE, diphenhydrAMINE, NIFEdipine **AND** NIFEdipine **AND** NIFEdipine **AND** labetalol **AND** Measure blood pressure, menthol-cetylpyridinium, naloxone **AND** sodium chloride flush, naLOXone (NARCAN) adult infusion for PRURITIS, ondansetron (ZOFRAN) IV, oxyCODONE, simethicone, zolpidem   Vital Signs    Vitals:   11/13/21 0005 11/13/21 0021 11/13/21 0535 11/13/21 0550  BP: (!) 167/107 (!) 148/99 (!) 161/109 (!) 158/109  Pulse: 90 87 94 89  Resp: 18 18 18    Temp:   97.7 F (36.5 C)   TempSrc:   Oral   SpO2: 100%  99%   Weight:      Height:        Intake/Output Summary (Last 24 hours) at 11/13/2021 0737 Last data filed at 11/13/2021 0537 Gross per 24 hour  Intake 3840 ml  Output 3660 ml  Net 180 ml   Filed Weights   11/06/21 1539  Weight: 115.7 kg    Telemetry    NA - Personally Reviewed  ECG    NA - Personally Reviewed  Physical Exam   GEN: No acute distress.   Neck: No  JVD Cardiac: RRR, no murmurs, rubs, or gallops.  Respiratory: Clear to auscultation bilaterally. GI: Soft, nontender, non-distended  MS:   Trace edema; No deformity. Neuro:  Nonfocal  Psych: Normal affect   Labs    Chemistry Recent Labs  Lab 11/06/21 1543 11/07/21 1311 11/10/21 0858  NA 135 132* 135  K 3.8 3.6 3.6  CL 106 104  104  CO2 19* 21* 22  GLUCOSE 78 95 87  BUN <5* 6 8  CREATININE 0.51 0.63 0.64  CALCIUM 9.0 7.9* 9.0  PROT 6.3* 5.2* 6.2*  ALBUMIN 3.1* 2.4* 3.0*  AST 24 22 37  ALT 26 20 36  ALKPHOS 103 85 87  BILITOT 0.9 0.5 0.7  GFRNONAA >60 >60 >60  ANIONGAP 10 7 9      Hematology Recent Labs  Lab 11/07/21 0127 11/07/21 1311 11/10/21 0858  WBC 15.0* 16.4* 11.5*  RBC 3.68* 3.36* 3.63*  HGB 10.5* 9.6* 10.1*  HCT 30.9* 28.2* 31.1*  MCV 84.0 83.9 85.7  MCH 28.5 28.6 27.8  MCHC 34.0 34.0 32.5  RDW 13.5 13.8 14.3  PLT 197 212 231    Cardiac EnzymesNo results for input(s): TROPONINI in the last 168 hours. No results for input(s): TROPIPOC in the last 168 hours.   BNP Recent Labs  Lab 11/10/21 0858  BNP 21.0     DDimer No results for input(s): DDIMER in the last 168 hours.   Radiology    No results found.  Cardiac Studies    TTE 11/11/2021  1. Left ventricular ejection fraction, by estimation, is 55 to 60%. The  left ventricle has normal function. The left ventricle has  no regional  wall motion abnormalities. There is moderate left ventricular hypertrophy.  Left ventricular diastolic  parameters were normal.   2. Right ventricular systolic function was not well visualized. The right  ventricular size is not well visualized.   3. A small pericardial effusion is present.   4. The mitral valve is normal in structure. No evidence of mitral valve  regurgitation. No evidence of mitral stenosis.   5. The aortic valve is tricuspid. Aortic valve regurgitation is not  visualized. No aortic stenosis is present.   6. The inferior vena cava is normal in size with greater than 50%  respiratory variability, suggesting right atrial pressure of 3 mmHg.   Patient Profile     25 y.o. female with chronic hypertensin and now accelerated postpartum hypertension. Recent delivery with twin.     Assessment & Plan    HTN:  BP still elevated.  Increase enalapril to 20 mg bid.  We would be OK  to go home when the primary team is OK.   Meds as on MAR.  She has follow up with Dr. Servando Salina virtually.    MODERATE LVH:    Net negative 10.2 liters.      For questions or updates, please contact [facility] Please consult [email or web address] for contact info under Cardiology/STEMI.   Signed, Rollene Rotunda, MD  11/13/2021, 7:37 AM

## 2021-11-13 NOTE — Progress Notes (Signed)
SVD: primary  S:  Pt reports feeling well. Reports anxiety every time BP is taken. Denies any other sx/ Tolerating po/ Voiding without problems/ No n/v/ Bleeding is light/ Pain controlled withprescription NSAID's including ibuprofen (Motrin)    O:  A & O x 3 / VS: Blood pressure (!) 159/93, pulse 85, temperature (!) 97.2 F (36.2 C), temperature source Oral, resp. rate 18, height 5\' 5"  (1.651 m), weight 115.7 kg, last menstrual period 02/25/2021, SpO2 99 %, unknown if currently breastfeeding.  LABS: No results found for this or any previous visit (from the past 24 hour(s)).  I&O: I/O last 3 completed shifts: In: 3840 [P.O.:3840] Out: 4760 [Urine:4760]   Total I/O In: -  Out: 200 [Urine:200] WDWN in NAD  Lungs: chest clear, no wheezing, rales, normal symmetric air entry  Heart: regular rate and rhythm, S1, S2 normal, no murmur, click, rub or gallop  Abdomen: uterus at umb  Perineum: not inspected  Lochia: light  Extremities:no redness or tenderness in the calves or thighs, no edema    A/P: POD # 7/PPD # 7/ 3841  Severe chronic HTN still not controlled. Will add hydalazine per the note from Dr [id number]. Lengthy discussion with pt and family regarding the reason for still remaining inpt at this point.   Continue routine post partum orders Will reassess this PM

## 2021-11-13 NOTE — Discharge Summary (Signed)
Postpartum Discharge Summary  Date of Service updated     Patient Name: Carrie Skinner DOB: January 24, 1996 MRN: [id number]  Date of admission: 11/06/2021 Delivery date:   Jamea, Robicheaux [[id number]]  11/06/2021    Rosslyn, Pasion [[id number]]  11/06/2021  Delivering provider:    Lakesha, Levinson [[id number]]  Ramah Langhans, Darya, Bigler [[id number]]  Brie Eppard  Date of discharge: 11/13/2021  Admitting diagnosis: malpresentation 2nd twin. Mono-di twin gestation @ 36 2/7 weeks, chronic hTN with severe superimposed preeclampsia Intrauterine pregnancy: [redacted]w[redacted]d    Secondary diagnosis:  Principal Problem:   Postpartum care following cesarean delivery Active Problems:   Malpresentation before onset of labor, fetus 2 of multiple gestation  Additional problems: chronic HTN affecting pregnancy    Discharge diagnosis: Preterm Pregnancy Delivered and CHTN with superimposed preeclampsia                                              Post partum procedures: n/a Augmentation: N/A Complications: None  Hospital course: Sceduled C/S   25y.o. yo G2P0112 at 371w2das admitted to the hospital 11/06/2021 for scheduled cesarean section with the following indication:Malpresentation.Delivery details are as follows:  Membrane Rupture Time/Date:    DiCasimer Lanius0[[id number]]6:31 PM    DiRhona Leavens0[[id number]]6:31 PM ,   DiCasimer Lanius0[[id number]]11/06/2021    DiRhona Leavens0[[id number]]11/06/2021   Delivery Method:   DiCasimer Lanius0[[id number]]C-Section, Low Transverse    DiRhona Leavens0[[id number]]C-Section, Low Transverse  Details of operation can be found in separate operative note.  Patient had a complicated postpartum course Pt was placed on Magnesium  sulfate for 24 hrs. She was continued to have elevated BP requiring  antihypertensive protocol. Pt was already on labetalol whose dose was increased and  Procardia XL added.  Due to ongoing severe range BP, cardiology consult was obtained to assist with its management.  Pt had Echo done and additional BP  medications added including Vasotec which increasing medication doses as well. She also was given Lasix x 3  with significant loss of fluid. Pt had hydralazine added to her regimen with improvement seen with BP. She is ambulating, tolerating a regular diet, passing flatus, and urinating well. Patient is discharged home in stable condition on  11/13/2021        Newborn Data: Birth date:   DiCasimer Lanius0[[id number]]11/06/2021    DiRhona Leavens0[[id number]]11/06/2021  Birth time:   DiCasimer Lanius0[[id number]]6:31 PM    DiRhona Leavens0[[id number]]6:31 PM  Gender:   DiCasimer Lanius0[[id number]]Female    DiRhona Leavens0[[id number]]Female  Living status:   DiCasimer Lanius0[[id number]]Living    DiRhona Leavens0[[id number]]Living  Apgars:   DiCasimer Lanius0[[id number]]8    DiRhona Leavens0[[id number]]8 [place]   DiCasimer Lanius0[[id number]]9    DiRhona Leavens0[[id number]]9  Weight:   DiCasimer Lanius0[[id number]]2.35 kg    DiRhona Leavens0[[id number]]2.52 kg     Magnesium Sulfate received: Yes: Seizure prophylaxis BMZ received: No Rhophylac:No MMR:No T-DaP:Given prenatally Flu: No  Transfusion:No  Physical exam  Vitals:   11/13/21 1415 11/13/21 1424 11/13/21 1526 11/13/21 1706  BP:  (!) 155/86 (!) 149/93 138/89  Pulse:  100 (!) 103 89  Resp:   18   Temp:   98.2 F (36.8 C)   TempSrc:      SpO2: 100%     Weight:      Height:       General: alert, cooperative, and no distress Lochia: appropriate Uterine Fundus: firm Incision: Healing well with no significant drainage DVT Evaluation: No evidence of DVT seen on physical exam. No significant calf/ankle edema. Labs: Lab Results  Component Value Date   WBC 11.5 (H) 11/10/2021   HGB 10.1  (L) 11/10/2021   HCT 31.1 (L) 11/10/2021   MCV 85.7 11/10/2021   PLT 231 11/10/2021   CMP Latest Ref Rng & Units 11/10/2021  Glucose 70 - 99 mg/dL 87  BUN 6 - 20 mg/dL 8  Creatinine 0.44 - 1.00 mg/dL 0.64  Sodium 135 - 145 mmol/L 135  Potassium 3.5 - 5.1 mmol/L 3.6  Chloride 98 - 111 mmol/L 104  CO2 22 - 32 mmol/L 22  Calcium 8.9 - 10.3 mg/dL 9.0  Total Protein 6.5 - 8.1 g/dL 6.2(L)  Total Bilirubin 0.3 - 1.2 mg/dL 0.7  Alkaline Phos 38 - 126 U/L 87  AST 15 - 41 U/L 37  ALT 0 - 44 U/L 36   Edinburgh Score: Edinburgh Postnatal Depression Scale Screening Tool 11/08/2021  I have been able to laugh and see the funny side of things. 0  I have looked forward with enjoyment to things. 0  I have blamed myself unnecessarily when things went wrong. 0  I have been anxious or worried for no good reason. 0  I have felt scared or panicky for no good reason. 0  Things have been getting on top of me. 1  I have been so unhappy that I have had difficulty sleeping. 0  I have felt sad or miserable. 0  I have been so unhappy that I have been crying. 0  The thought of harming myself has occurred to me. 0  Edinburgh Postnatal Depression Scale Total 1      After visit meds:  Allergies as of 11/13/2021   No Known Allergies      Medication List     STOP taking these medications    acetaminophen 500 MG tablet Commonly known as: TYLENOL   calcium carbonate 500 MG chewable tablet Commonly known as: TUMS - dosed in mg elemental calcium       TAKE these medications    enalapril 20 MG tablet Commonly known as: VASOTEC Take 1 tablet (20 mg total) by mouth 2 (two) times daily.   ferrous sulfate 325 (65 FE) MG tablet Take 325 mg by mouth daily with breakfast.   hydrALAZINE 25 MG tablet Commonly known as: APRESOLINE Take 1 tablet (25 mg total) by mouth every 8 (eight) hours.   ibuprofen 800 MG tablet Commonly known as: ADVIL Take 1 tablet (800 mg total) by mouth every 8 (eight)  hours as needed.   labetalol 300 MG tablet Commonly known as: NORMODYNE Take 1 tablet (300 mg total) by mouth 3 (three) times daily. What changed:  medication strength how much to take when to take this   NIFEdipine 90 MG 24 hr tablet Commonly known as: PROCARDIA XL/NIFEDICAL-XL Take 1 tablet (90 mg total) by mouth daily. Start taking on: November 14, 2021   prenatal multivitamin  Tabs tablet Take 1 tablet by mouth in the morning.   vitamin C 500 MG tablet Commonly known as: ASCORBIC ACID Take 500 mg by mouth in the morning.         Discharge home in stable condition Infant Feeding: Breast Infant Disposition:home with mother Discharge instruction: per After Visit Summary and Postpartum booklet. Activity: Advance as tolerated. Pelvic rest for 6 weeks.  Diet: iron rich diet Anticipated Birth Control: Condoms Postpartum Appointment:6 weeks Additional Postpartum F/U: BP check 2-3 days Future Appointments: Future Appointments  Date Time Provider [place]  11/18/2021 11:00 AM Tobb, Godfrey Pick, DO [facility]   Follow up Visit:  Follow-up Information     Tobb, Kardie, DO Follow up on 11/18/2021.   Specialty: Cardiology Contact information: [address] [place] [address] [place] [address] [phone]         Servando Salina, MD Follow up in 6 week(s).   Specialty: Obstetrics and Gynecology Contact information: [address] [place] [address] [phone]                     11/13/2021 Marvene Staff, MD

## 2021-11-13 NOTE — Lactation Note (Signed)
This note was copied from a baby's chart. Lactation Consultation Note  Patient Name: Carrie Skinner OMVEH'M Date: 11/13/2021   Age:27 days  Mom in with OBGYN provider. LC reviewed medications Vasotec ( L2 [place]), Procardia (L2), Labetalol (L2) and Hydralazine (L2)   Mom using dEBP q 3hrs for 15 min offering back to twins what she is has prior to formula.  Twin A and Twin B both gaining weight and are being fed Ad lib.  Mom provided with outpatient [facility] brochure to make follow up appointment to discuss her milk supply.    Maternal Data    Feeding    LATCH Score                    Lactation Tools Discussed/Used    Interventions    Discharge    Consult Status      [name]  [name] 11/13/2021, 1:44 PM

## 2021-11-13 NOTE — Progress Notes (Signed)
Patient Vitals for the past 24 hrs:  BP Temp Temp src Pulse Resp SpO2  11/13/21 1706 138/89 -- -- 89 -- --  11/13/21 1526 (!) 149/93 98.2 F (36.8 C) -- (!) 103 18 --  11/13/21 1424 (!) 155/86 -- -- 100 -- --  11/13/21 1415 -- -- -- -- -- 100 %  11/13/21 1410 -- -- -- -- -- 100 %  11/13/21 1405 -- -- -- -- -- 99 %  11/13/21 1325 (!) 161/103 -- -- 86 -- 100 %  11/13/21 1320 (!) 178/106 -- -- 94 -- --  11/13/21 0840 (!) 159/93 (!) 97.2 F (36.2 C) Oral 85 18 --  11/13/21 0550 (!) 158/109 -- -- 89 -- --  11/13/21 0535 (!) 161/109 97.7 F (36.5 C) Oral 94 18 99 %  11/13/21 0021 (!) 148/99 -- -- 87 18 --  11/13/21 0005 (!) 167/107 -- -- 90 18 100 %  11/12/21 2048 (!) 151/99 -- -- 94 18 --  11/12/21 2031 (!) 164/97 97.8 F (36.6 C) Oral 97 18 100 %   Marked improvement in BP with addition of hydralazine, Based on the above BP, felt more comfortable discharging pt Who is quite capable of checking BP at home. Spoke with Dr Servando Salina who concur with my plan Who recommend pt check BP at home twice a day as of tomorrow and send report via mychart. Pt has f/u appt with her on Tuesday and is aware to call if problems with BP at home Pt extremely excited to go home

## 2021-11-18 ENCOUNTER — Telehealth (INDEPENDENT_AMBULATORY_CARE_PROVIDER_SITE_OTHER): Payer: No Typology Code available for payment source | Admitting: Cardiology

## 2021-11-18 ENCOUNTER — Other Ambulatory Visit: Payer: Self-pay

## 2021-11-18 VITALS — BP 134/86 | Wt 222.0 lb

## 2021-11-18 DIAGNOSIS — O165 Unspecified maternal hypertension, complicating the puerperium: Secondary | ICD-10-CM | POA: Diagnosis not present

## 2021-11-18 DIAGNOSIS — I10 Essential (primary) hypertension: Secondary | ICD-10-CM | POA: Insufficient documentation

## 2021-11-18 DIAGNOSIS — E669 Obesity, unspecified: Secondary | ICD-10-CM | POA: Diagnosis not present

## 2021-11-18 MED ORDER — LABETALOL HCL 300 MG PO TABS: 300.0000 mg | ORAL_TABLET | Freq: Three times a day (TID) | ORAL | 3 refills | Status: DC

## 2021-11-18 MED ORDER — HYDRALAZINE HCL 25 MG PO TABS: 25.0000 mg | ORAL_TABLET | Freq: Three times a day (TID) | ORAL | 3 refills | Status: DC

## 2021-11-18 MED ORDER — ENALAPRIL MALEATE 20 MG PO TABS: 20.0000 mg | ORAL_TABLET | Freq: Two times a day (BID) | ORAL | 3 refills | Status: DC

## 2021-11-18 MED ORDER — NIFEDIPINE ER OSMOTIC RELEASE 90 MG PO TB24: 90.0000 mg | ORAL_TABLET | Freq: Every day | ORAL | 3 refills | Status: DC

## 2021-11-18 NOTE — Patient Instructions (Addendum)
Medication Instructions:  Your physician recommends that you continue on your current medications as directed. Please refer to the Current Medication list given to you today.  Refills sent *If you need a refill on your cardiac medications before your next appointment, please call your pharmacy*   Lab Work: None If you have labs (blood work) drawn today and your tests are completely normal, you will receive your results only by: MyChart Message (if you have MyChart) OR A paper copy in the mail If you have any lab test that is abnormal or we need to change your treatment, we will call you to review the results.   Testing/Procedures: None   Follow-Up: At [facility], you and your health needs are our priority.  As part of our continuing mission to provide you with exceptional heart care, we have created designated Provider Care Teams.  These Care Teams include your primary Cardiologist (physician) and Advanced Practice Providers (APPs -  Physician Assistants and Nurse Practitioners) who all work together to provide you with the care you need, when you need it.  We recommend signing up for the patient portal called "MyChart".  Sign up information is provided on this After Visit Summary.  MyChart is used to connect with patients for Virtual Visits (Telemedicine).  Patients are able to view lab/test results, encounter notes, upcoming appointments, etc.  Non-urgent messages can be sent to your provider as well.   To learn more about what you can do with MyChart, go to [email or web address].    Your next appointment:   4-6 week(s)  The format for your next appointment:   Virtual Visit   Provider:   Thomasene Ripple, DO     Other Instructions

## 2021-11-18 NOTE — Progress Notes (Addendum)
Cardio-Obstetrics Clinic  Follow Up Note   Date:  11/18/2021   ID:  Carrie Skinner, DOB 02-24-1996, MRN [id number]  PCP:  Glori Luis, MD   [facility] Providers Cardiologist:  Thomasene Ripple, DO  Electrophysiologist:  None        Referring MD: Glori Luis, MD   Chief Complaint: " I am doing a lot better"   Virtual Visit via Video  Note . I connected with the patient today by a   video enabled telemedicine application and verified that I am speaking with the correct person using two identifiers.  The patient is at home. I am in the clinic.  History of Present Illness:    Carrie Skinner is a 25 y.o. female [G2P0112] who returns for follow up of postpartum hypertension.  Is her chronic hypertension with moderate LVH and obesity.  The patient recently delivered twin and was in the hospital for several days after due to) hypertension.  She is currently on 4 antihypertensive medications which include enalapril 20 mg twice daily, labetalol 300 mg 3 times daily, nifedipine 90 mg daily and hydralazine 25 mg every 8 hours.  Since she left the hospital she tells me her blood pressure has been staying less within target.  Is now averaging between 125-135. mmHg. she notes she had a few days where she felt slight lightheadedness but this has improved significantly.  No other complaints at this time.  Prior CV Studies Reviewed: The following studies were reviewed today:  TTE 11/11/2021  1. Left ventricular ejection fraction, by estimation, is 55 to 60%. The left ventricle has normal function. The left ventricle has no regional wall motion abnormalities. There is moderate left ventricular hypertrophy. Left ventricular diastolic parameters were normal.   2. Right ventricular systolic function was not well visualized. The right ventricular size is not well visualized.   3. A small pericardial effusion is present.   4. The mitral valve is normal in structure. No evidence of  mitral valve regurgitation. No evidence of mitral stenosis.   5. The aortic valve is tricuspid. Aortic valve regurgitation is not visualized. No aortic stenosis is present.   6. The inferior vena cava is normal in size with greater than 50% respiratory variability, suggesting right atrial pressure of 3 mmHg.     Past Medical History:  Diagnosis Date   Hypertension    UTI (lower urinary tract infection)     Past Surgical History:  Procedure Laterality Date   CESAREAN SECTION MULTI-GESTATIONAL N/A 11/06/2021   Procedure: CESAREAN SECTION MULTI-GESTATIONAL;  Surgeon: Maxie Better, MD;  Location: [facility] LD ORS;  Service: Obstetrics;  Laterality: N/A;   TONSILLECTOMY        OB History     Gravida  2   Para  1   Term      Preterm  1   AB  1   Living  2      SAB  0   IAB  1   Ectopic      Multiple  1   Live Births  2               Current Medications: Current Meds  Medication Sig   ibuprofen (ADVIL) 800 MG tablet Take 1 tablet (800 mg total) by mouth every 8 (eight) hours as needed.   [DISCONTINUED] enalapril (VASOTEC) 20 MG tablet Take 1 tablet (20 mg total) by mouth 2 (two) times daily.   [DISCONTINUED] hydrALAZINE (APRESOLINE) 25 MG tablet  Take 1 tablet (25 mg total) by mouth every 8 (eight) hours.   [DISCONTINUED] labetalol (NORMODYNE) 300 MG tablet Take 1 tablet (300 mg total) by mouth 3 (three) times daily.   [DISCONTINUED] NIFEdipine (PROCARDIA XL/NIFEDICAL-XL) 90 MG 24 hr tablet Take 1 tablet (90 mg total) by mouth daily.     Allergies:   Patient has no known allergies.   Social History   Socioeconomic History   Marital status: Single    Spouse name: Not on file   Number of children: Not on file   Years of education: Not on file   Highest education level: Not on file  Occupational History   Not on file  Tobacco Use   Smoking status: Never   Smokeless tobacco: Never  Vaping Use   Vaping Use: Never used  Substance and Sexual Activity    Alcohol use: Not Currently    Alcohol/week: 2.0 standard drinks    Types: 2 Cans of beer per week   Drug use: No   Sexual activity: Yes    Partners: Male  Other Topics Concern   Not on file  Social History Narrative   Patient is a Archivist at [employer].   Social Determinants of Health   Financial Resource Strain: Not on file  Food Insecurity: Not on file  Transportation Needs: Not on file  Physical Activity: Not on file  Stress: Not on file  Social Connections: Not on file      Family History  Problem Relation Age of Onset   Arthritis Other    Breast cancer Other    Hypertension Other    Diabetes Other    Stroke Cousin        Early 30s      ROS:   Please see the history of present illness.     All other systems reviewed and are negative.   Labs/EKG Reviewed:    EKG:   EKG is was not ordered today.   Recent Labs: 11/07/2021: Magnesium 3.8 11/10/2021: ALT 36; B Natriuretic Peptide 21.0; BUN 8; Creatinine, Ser 0.64; Hemoglobin 10.1; Platelets 231; Potassium 3.6; Sodium 135   Recent Lipid Panel Lab Results  Component Value Date/Time   CHOL 158 06/28/2019 02:30 PM   TRIG 77.0 06/28/2019 02:30 PM   HDL 41.60 06/28/2019 02:30 PM   CHOLHDL 4 06/28/2019 02:30 PM   LDLCALC 101 (H) 06/28/2019 02:30 PM    Physical Exam:    VS:  BP 134/86    Wt 222 lb (100.7 kg)    LMP 02/25/2021    BMI 36.94 kg/m     Wt Readings from Last 3 Encounters:  11/18/21 222 lb (100.7 kg)  11/06/21 255 lb 1.6 oz (115.7 kg)  08/07/19 222 lb 3.2 oz (100.8 kg)     GEN:  Well nourished, well developed in no acute distress HEENT: Normal NECK: No JVD; No carotid bruits LYMPHATICS: No lymphadenopathy CARDIAC: RRR, no murmurs, rubs, gallops RESPIRATORY:  Clear to auscultation without rales, wheezing or rhonchi  ABDOMEN: Soft, non-tender, non-distended MUSCULOSKELETAL:  No edema; No deformity  SKIN: Warm and dry NEUROLOGIC:  Alert and oriented x 3 PSYCHIATRIC:  Normal affect     Risk Assessment/Risk Calculators:                 ASSESSMENT & PLAN:    Postpartum Hypertension Chronic hypertension  obesity  Thankfully her blood pressure has improved she is currently on a regimen that we are going to keep her enalapril 20  mg twice daily, hydralazine 25 mg every 8 hours, labetalol 300 mg 3 times daily, nifedipine 90 mg daily.  We will give the patient up to 6 weeks postpartum before considering to titrate down this antihypertensive. Total time spent with the patient 12 minutes  The patient understands the need to lose weight with diet and exercise. We have discussed specific strategies for this.    Patient Instructions  Medication Instructions:  Your physician recommends that you continue on your current medications as directed. Please refer to the Current Medication list given to you today.  Refills sent *If you need a refill on your cardiac medications before your next appointment, please call your pharmacy*   Lab Work: None If you have labs (blood work) drawn today and your tests are completely normal, you will receive your results only by: MyChart Message (if you have MyChart) OR A paper copy in the mail If you have any lab test that is abnormal or we need to change your treatment, we will call you to review the results.   Testing/Procedures: None   Follow-Up: At [facility], you and your health needs are our priority.  As part of our continuing mission to provide you with exceptional heart care, we have created designated Provider Care Teams.  These Care Teams include your primary Cardiologist (physician) and Advanced Practice Providers (APPs -  Physician Assistants and Nurse Practitioners) who all work together to provide you with the care you need, when you need it.  We recommend signing up for the patient portal called "MyChart".  Sign up information is provided on this After Visit Summary.  MyChart is used to connect with patients for  Virtual Visits (Telemedicine).  Patients are able to view lab/test results, encounter notes, upcoming appointments, etc.  Non-urgent messages can be sent to your provider as well.   To learn more about what you can do with MyChart, go to [email or web address].    Your next appointment:   4-6 week(s)  The format for your next appointment:   Virtual Visit   Provider:   Thomasene Ripple, DO     Other Instructions     Dispo:  No follow-ups on file.   Medication Adjustments/Labs and Tests Ordered: Current medicines are reviewed at length with the patient today.  Concerns regarding medicines are outlined above.  Tests Ordered: No orders of the defined types were placed in this encounter.  Medication Changes: Meds ordered this encounter  Medications   enalapril (VASOTEC) 20 MG tablet    Sig: Take 1 tablet (20 mg total) by mouth 2 (two) times daily.    Dispense:  180 tablet    Refill:  3   hydrALAZINE (APRESOLINE) 25 MG tablet    Sig: Take 1 tablet (25 mg total) by mouth every 8 (eight) hours.    Dispense:  270 tablet    Refill:  3   labetalol (NORMODYNE) 300 MG tablet    Sig: Take 1 tablet (300 mg total) by mouth 3 (three) times daily.    Dispense:  270 tablet    Refill:  3   NIFEdipine (PROCARDIA XL/NIFEDICAL-XL) 90 MG 24 hr tablet    Sig: Take 1 tablet (90 mg total) by mouth daily.    Dispense:  90 tablet    Refill:  3

## 2021-11-19 ENCOUNTER — Telehealth (HOSPITAL_COMMUNITY): Payer: Self-pay | Admitting: *Deleted

## 2021-11-19 NOTE — Telephone Encounter (Signed)
Hospital Discharge Follow-Up Call:  Patient reports that she is doing well and has no concerns about her healing process.  She says her blood pressure is at target goals with the medication she is taking.  EPDS today was 9 and patient endorses that overall she is doing well emotionally.  She acknowledges stressors related to caring for newborn twins and having complications postpartum that resulted in a longer than expected hospitalization.  Patient says that babies are well and she has no concerns about their health.  She reports that babies sleep in a bassinet with a divider, specially designed for twins.  ABCs of Safe Sleep reviewed.

## 2021-12-15 ENCOUNTER — Telehealth (INDEPENDENT_AMBULATORY_CARE_PROVIDER_SITE_OTHER): Payer: No Typology Code available for payment source | Admitting: Cardiology

## 2021-12-15 ENCOUNTER — Encounter: Payer: Self-pay | Admitting: Cardiology

## 2021-12-15 ENCOUNTER — Other Ambulatory Visit: Payer: Self-pay

## 2021-12-15 VITALS — BP 116/69 | HR 70 | Ht 65.0 in | Wt 215.0 lb

## 2021-12-15 DIAGNOSIS — O165 Unspecified maternal hypertension, complicating the puerperium: Secondary | ICD-10-CM

## 2021-12-15 DIAGNOSIS — I10 Essential (primary) hypertension: Secondary | ICD-10-CM | POA: Diagnosis not present

## 2021-12-15 DIAGNOSIS — E669 Obesity, unspecified: Secondary | ICD-10-CM | POA: Diagnosis not present

## 2021-12-15 NOTE — Patient Instructions (Signed)

## 2021-12-15 NOTE — Progress Notes (Signed)
Cardio-Obstetrics Clinic  Follow Up Note   Date:  12/16/2021   ID:  Carrie Skinner, DOB 1996-09-29, MRN [id number]  PCP:  Leone Haven, MD   [facility] Cardiologist:  Berniece Salines, DO  Electrophysiologist:  None        Referring MD: Leone Haven, MD   Chief Complaint: " I am doing a lot better"  Virtual Visit via Video  Note . I connected with the patient today by a   video enabled telemedicine application and verified that I am speaking with the correct person using two identifiers.  The patient is at home. I am in the clinic.  History of Present Illness:    Carrie Skinner is a 26 y.o. female [[id number]] who returns for follow up of postpartum follow up.Medical history includes: Hypertension, obesity.  I first met the patient after she had delivered her twin girls and was in the hospital for several days due to accelerated hypertension.  She was discharged on 4 antihypertensive medications enalapril 20 mg twice a day, labetalol 300 mg 3 times daily, nifedipine 90 mg daily and hydralazine 25 mg every 8 hours.  At her last visit we discussed her antihypertensive regimen.  Her blood pressure had improved.  Therefore no changes were made.  Today she tells me her systolics is usually 570-177 mmHG.  She has no lightheadedness no dizziness.  Prior CV Studies Reviewed: The following studies were reviewed today:  TTE 11/11/2021  1. Left ventricular ejection fraction, by estimation, is 55 to 60%. The left ventricle has normal function. The left ventricle has no regional wall motion abnormalities. There is moderate left ventricular hypertrophy. Left ventricular diastolic parameters were normal.   2. Right ventricular systolic function was not well visualized. The right ventricular size is not well visualized.   3. A small pericardial effusion is present.   4. The mitral valve is normal in structure. No evidence of mitral valve regurgitation. No evidence of  mitral stenosis.   5. The aortic valve is tricuspid. Aortic valve regurgitation is not visualized. No aortic stenosis is present.   6. The inferior vena cava is normal in size with greater than 50% respiratory variability, suggesting right atrial pressure of 3 mmHg.     Past Medical History:  Diagnosis Date   Hypertension    UTI (lower urinary tract infection)     Past Surgical History:  Procedure Laterality Date   CESAREAN SECTION MULTI-GESTATIONAL N/A 11/06/2021   Procedure: CESAREAN SECTION MULTI-GESTATIONAL;  Surgeon: Servando Salina, MD;  Location: [facility];  Service: Obstetrics;  Laterality: N/A;   TONSILLECTOMY        OB History     Gravida  2   Para  1   Term      Preterm  1   AB  1   Living  2      SAB  0   IAB  1   Ectopic      Multiple  1   Live Births  2               Current Medications: Current Meds  Medication Sig   busPIRone (BUSPAR) 5 MG tablet Take 5 mg by mouth 2 (two) times daily.   enalapril (VASOTEC) 20 MG tablet Take 1 tablet (20 mg total) by mouth 2 (two) times daily.   ferrous sulfate 325 (65 FE) MG tablet Take 325 mg by mouth daily with breakfast.   hydrALAZINE (APRESOLINE) 25 MG  tablet Take 1 tablet (25 mg total) by mouth every 8 (eight) hours.   ibuprofen (ADVIL) 800 MG tablet Take 1 tablet (800 mg total) by mouth every 8 (eight) hours as needed.   labetalol (NORMODYNE) 300 MG tablet Take 1 tablet (300 mg total) by mouth 3 (three) times daily.   NIFEdipine (PROCARDIA XL/NIFEDICAL-XL) 90 MG 24 hr tablet Take 1 tablet (90 mg total) by mouth daily.   Prenatal Vit-Fe Fumarate-FA (PRENATAL MULTIVITAMIN) TABS tablet Take 1 tablet by mouth in the morning.   vitamin C (ASCORBIC ACID) 500 MG tablet Take 500 mg by mouth in the morning.     Allergies:   Patient has no known allergies.   Social History   Socioeconomic History   Marital status: Single    Spouse name: Not on file   Number of children: Not on file   Years of  education: Not on file   Highest education level: Not on file  Occupational History   Not on file  Tobacco Use   Smoking status: Never   Smokeless tobacco: Never  Vaping Use   Vaping Use: Never used  Substance and Sexual Activity   Alcohol use: Not Currently    Alcohol/week: 2.0 standard drinks    Types: 2 Cans of beer per week   Drug use: No   Sexual activity: Yes    Partners: Male  Other Topics Concern   Not on file  Social History Narrative   Patient is a Electronics engineer at [employer].   Social Determinants of Health   Financial Resource Strain: Not on file  Food Insecurity: Not on file  Transportation Needs: Not on file  Physical Activity: Not on file  Stress: Not on file  Social Connections: Not on file      Family History  Problem Relation Age of Onset   Arthritis Other    Breast cancer Other    Hypertension Other    Diabetes Other    Stroke Cousin        Early 26s      ROS:   Please see the history of present illness.     All other systems reviewed and are negative.   Labs/EKG Reviewed:    EKG:   EKG is was not ordered today.   Recent Labs: 11/07/2021: Magnesium 3.8 11/10/2021: ALT 36; B Natriuretic Peptide 21.0; BUN 8; Creatinine, Ser 0.64; Hemoglobin 10.1; Platelets 231; Potassium 3.6; Sodium 135   Recent Lipid Panel Lab Results  Component Value Date/Time   CHOL 158 06/28/2019 02:30 PM   TRIG 77.0 06/28/2019 02:30 PM   HDL 41.60 06/28/2019 02:30 PM   CHOLHDL 4 06/28/2019 02:30 PM   LDLCALC 101 (H) 06/28/2019 02:30 PM    Physical Exam:    VS:  BP 116/69    Pulse 70    Ht $R'5\' 5"'kS$  (1.651 m)    Wt 215 lb (97.5 kg)    LMP 02/25/2021    BMI 35.78 kg/m     Wt Readings from Last 3 Encounters:  12/15/21 215 lb (97.5 kg)  11/18/21 222 lb (100.7 kg)  11/06/21 255 lb 1.6 oz (115.7 kg)    Virtual visit, no physical exam performed.  Risk Assessment/Risk Calculators:                 ASSESSMENT & PLAN:    Postpartum  Hypertension Chronic hypertension  Obesity   She is controlled on her current regimen including enalapril 20 mg twice a day, hydralazine 25  mg every 8 hours, labetalol 300 mg 3 times daily, nifedipine 90 mg daily.  We will keep her on this regimen for now.  I am very hesitant to titrate down her antihypertensive medication given her known chronic hypertension.  I think 12 weeks postpartum we can start to adjust medication to her regimen that can keep her less than 130/80 millimeters.  She is in agreement with the plan.  I will see her in 6 weeks from now which will be a full 12 weeks postpartum and then we can discuss titrating her medication.  The patient understands the need to lose weight with diet and exercise. We have discussed specific strategies for this.    Patient Instructions  Medication Instructions:  Your physician recommends that you continue on your current medications as directed. Please refer to the Current Medication list given to you today.  *If you need a refill on your cardiac medications before your next appointment, please call your pharmacy*   Lab Work: None If you have labs (blood work) drawn today and your tests are completely normal, you will receive your results only by: [place] (if you have MyChart) OR A paper copy in the mail If you have any lab test that is abnormal or we need to change your treatment, we will call you to review the results.   Testing/Procedures: None   Follow-Up: At [facility], you and your health needs are our priority.  As part of our continuing mission to provide you with exceptional heart care, we have created designated Provider Care Teams.  These Care Teams include your primary Cardiologist (physician) and Advanced Practice Providers (APPs -  Physician Assistants and Nurse Practitioners) who all work together to provide you with the care you need, when you need it.  We recommend signing up for the patient portal  called "MyChart".  Sign up information is provided on this After Visit Summary.  MyChart is used to connect with patients for Virtual Visits (Telemedicine).  Patients are able to view lab/test results, encounter notes, upcoming appointments, etc.  Non-urgent messages can be sent to your provider as well.   To learn more about what you can do with MyChart, go to [email or web address].    Your next appointment:   8 week(s)  The format for your next appointment:   In Person  Provider:   Berniece Salines, DO     Other Instructions     Dispo:  Return in about 8 weeks (around 02/09/2022).   Medication Adjustments/Labs and Tests Ordered: Current medicines are reviewed at length with the patient today.  Concerns regarding medicines are outlined above.  Tests Ordered: No orders of the defined types were placed in this encounter.  Medication Changes: No orders of the defined types were placed in this encounter.

## 2021-12-18 ENCOUNTER — Other Ambulatory Visit: Payer: Self-pay

## 2021-12-18 MED ORDER — HYDRALAZINE HCL 25 MG PO TABS: 25.0000 mg | ORAL_TABLET | Freq: Two times a day (BID) | ORAL | 3 refills | Status: DC

## 2021-12-18 NOTE — Progress Notes (Signed)
Med list updated. Hydralazine decreased to twice daily per Dr. Servando Salina.

## 2022-01-13 ENCOUNTER — Telehealth: Payer: Self-pay

## 2022-01-13 NOTE — Telephone Encounter (Signed)
Called pt, no answer. Voicemail full.

## 2022-02-11 ENCOUNTER — Other Ambulatory Visit: Payer: Self-pay

## 2022-02-11 ENCOUNTER — Ambulatory Visit (INDEPENDENT_AMBULATORY_CARE_PROVIDER_SITE_OTHER): Payer: No Typology Code available for payment source | Admitting: Cardiology

## 2022-02-11 VITALS — BP 110/72 | HR 77 | Ht 65.0 in | Wt 233.0 lb

## 2022-02-11 DIAGNOSIS — I10 Essential (primary) hypertension: Secondary | ICD-10-CM

## 2022-02-11 DIAGNOSIS — E669 Obesity, unspecified: Secondary | ICD-10-CM | POA: Diagnosis not present

## 2022-02-11 DIAGNOSIS — O165 Unspecified maternal hypertension, complicating the puerperium: Secondary | ICD-10-CM

## 2022-02-11 MED ORDER — NIFEDIPINE ER OSMOTIC RELEASE 60 MG PO TB24: 60.0000 mg | ORAL_TABLET | Freq: Every day | ORAL | 3 refills | Status: DC

## 2022-02-11 NOTE — Patient Instructions (Addendum)
Medication Instructions:  ?Your physician has recommended you make the following change in your medication:  ?DECREASE: Nifedipine 60 mg once daily ? ?Please take your blood pressure daily for 2-3 weeks and send in a MyChart message. Please include heart rates.  ? ?*If you need a refill on your cardiac medications before your next appointment, please call your pharmacy* ? ? ?Lab Work: ?None ?If you have labs (blood work) drawn today and your tests are completely normal, you will receive your results only by: ?MyChart Message (if you have MyChart) OR ?A paper copy in the mail ?If you have any lab test that is abnormal or we need to change your treatment, we will call you to review the results. ? ? ?Testing/Procedures: ?None ? ? ?Follow-Up: ?At [facility], you and your health needs are our priority.  As part of our continuing mission to provide you with exceptional heart care, we have created designated Provider Care Teams.  These Care Teams include your primary Cardiologist (physician) and Advanced Practice Providers (APPs -  Physician Assistants and Nurse Practitioners) who all work together to provide you with the care you need, when you need it. ? ?We recommend signing up for the patient portal called "MyChart".  Sign up information is provided on this After Visit Summary.  MyChart is used to connect with patients for Virtual Visits (Telemedicine).  Patients are able to view lab/test results, encounter notes, upcoming appointments, etc.  Non-urgent messages can be sent to your provider as well.   ?To learn more about what you can do with MyChart, go to [email or web address].   ? ?Your next appointment:   ?12 week(s) ? ?The format for your next appointment:   ?Virtual Visit  ? ?Provider:   ?Thomasene Ripple, DO   ? ? ?Other Instructions ?  ?

## 2022-02-11 NOTE — Progress Notes (Signed)
?[facility] ? ?Follow Up Note ? ? ?Date:  02/11/2022  ? ?ID:  Carrie Skinner, DOB 1996/04/28, MRN [id number] ? ?PCP:  Leone Haven, MD ?  ?[facility] ?Cardiologist:  Berniece Salines, DO  ?Electrophysiologist:  None      ? ? ?Referring MD: Leone Haven, MD  ? ?Chief Complaint: " I am doing fine" ? ?History of Present Illness:   ? ?Carrie Skinner is a 26 y.o. female [[id number]] who returns for follow up of chronic hypertension in pregnancy. ? ?I first met the patient after she had delivered her twin girls and was in the hospital for several days due to accelerated hypertension.  She was discharged on 4 antihypertensive medications enalapril 20 mg twice a day, labetalol 300 mg 3 times daily, nifedipine 90 mg daily and hydralazine 25 mg every 8 hours. ?  ?Her most recent visit was a virtual visit during that time blood pressure was controlled on her current regimen we kept her on this regimen given the fact that she was still on the 12 weeks postpartum. ? ?Since her last visit she did contact me and let me know that she was having significantly low blood pressure.  With this we stopped the hydralazine. ? ?She is here today for follow-up visit.  She is doing well.  Her blood pressure is usually within systolics of 803 to 212 mmHg. ? ?No chest pain, no shortness of breath.  She is back to work.  She is no longer breast-feeding. ? ?Prior CV Studies Reviewed: ?The following studies were reviewed today: ? ?TTE 11/11/2021 ? 1. Left ventricular ejection fraction, by estimation, is 55 to 60%. The left ventricle has normal function. The left ventricle has no regional wall motion abnormalities. There is moderate left ventricular hypertrophy. Left ventricular diastolic parameters were normal.  ? 2. Right ventricular systolic function was not well visualized. The right ventricular size is not well visualized.  ? 3. A small pericardial effusion is present.  ? 4. The mitral valve is normal in  structure. No evidence of mitral valve regurgitation. No evidence of mitral stenosis.  ? 5. The aortic valve is tricuspid. Aortic valve regurgitation is not visualized. No aortic stenosis is present.  ? 6. The inferior vena cava is normal in size with greater than 50% respiratory variability, suggesting right atrial pressure of 3 mmHg.  ?  ? ?Past Medical History:  ?Diagnosis Date  ? Hypertension   ? UTI (lower urinary tract infection)   ? ? ?Past Surgical History:  ?Procedure Laterality Date  ? CESAREAN SECTION MULTI-GESTATIONAL N/A 11/06/2021  ? Procedure: CESAREAN SECTION MULTI-GESTATIONAL;  Surgeon: Servando Salina, MD;  Location: [facility] LD ORS;  Service: Obstetrics;  Laterality: N/A;  ? TONSILLECTOMY    ?   ? ?OB History   ? ? Gravida  ?2  ? Para  ?1  ? Term  ?   ? Preterm  ?1  ? AB  ?1  ? Living  ?2  ?  ? ? SAB  ?0  ? IAB  ?1  ? Ectopic  ?   ? Multiple  ?1  ? Live Births  ?2  ?   ?  ?  ?    ? ? ?Current Medications: ?Current Meds  ?Medication Sig  ? busPIRone (BUSPAR) 5 MG tablet Take 5 mg by mouth 2 (two) times daily.  ? enalapril (VASOTEC) 20 MG tablet Take 1 tablet (20 mg total) by mouth 2 (two) times daily.  ?  ibuprofen (ADVIL) 800 MG tablet Take 1 tablet (800 mg total) by mouth every 8 (eight) hours as needed.  ? labetalol (NORMODYNE) 300 MG tablet Take 1 tablet (300 mg total) by mouth 3 (three) times daily.  ? Prenatal Vit-Fe Fumarate-FA (PRENATAL MULTIVITAMIN) TABS tablet Take 1 tablet by mouth in the morning.  ? [DISCONTINUED] NIFEdipine (PROCARDIA XL/NIFEDICAL XL) 60 MG 24 hr tablet Take 1 tablet (60 mg total) by mouth daily.  ? [DISCONTINUED] NIFEdipine (PROCARDIA XL/NIFEDICAL-XL) 90 MG 24 hr tablet Take 1 tablet (90 mg total) by mouth daily.  ?  ? ?Allergies:   Patient has no known allergies.  ? ?Social History  ? ?Socioeconomic History  ? Marital status: Married  ?  Spouse name: Not on file  ? Number of children: Not on file  ? Years of education: Not on file  ? Highest education level: Not on file   ?Occupational History  ? Not on file  ?Tobacco Use  ? Smoking status: Never  ? Smokeless tobacco: Never  ?Vaping Use  ? Vaping Use: Never used  ?Substance and Sexual Activity  ? Alcohol use: Not Currently  ?  Alcohol/week: 2.0 standard drinks  ?  Types: 2 Cans of beer per week  ? Drug use: No  ? Sexual activity: Yes  ?  Partners: Male  ?Other Topics Concern  ? Not on file  ?Social History Narrative  ? Patient is a Electronics engineer at [employer].  ? ?Social Determinants of Health  ? ?Financial Resource Strain: Not on file  ?Food Insecurity: Not on file  ?Transportation Needs: Not on file  ?Physical Activity: Not on file  ?Stress: Not on file  ?Social Connections: Not on file  ?  ? ? ?Family History  ?Problem Relation Age of Onset  ? Arthritis Other   ? Breast cancer Other   ? Hypertension Other   ? Diabetes Other   ? Stroke Cousin   ?     Early 30s  ?   ? ?ROS:   ?Please see the history of present illness.    ? ?All other systems reviewed and are negative. ? ? ?Labs/EKG Reviewed:   ? ?EKG:   ?EKG is was not ordered today.  ? ?Recent Labs: ?11/07/2021: Magnesium 3.8 ?11/10/2021: ALT 36; B Natriuretic Peptide 21.0; BUN 8; Creatinine, Ser 0.64; Hemoglobin 10.1; Platelets 231; Potassium 3.6; Sodium 135  ? ?Recent Lipid Panel ?Lab Results  ?Component Value Date/Time  ? CHOL 158 06/28/2019 02:30 PM  ? TRIG 77.0 06/28/2019 02:30 PM  ? HDL 41.60 06/28/2019 02:30 PM  ? CHOLHDL 4 06/28/2019 02:30 PM  ? LDLCALC 101 (H) 06/28/2019 02:30 PM  ? ? ?Physical Exam:   ? ?VS:  BP 110/72   Pulse 77   Ht 5' 5"  (1.651 m)   Wt 233 lb (105.7 kg)   SpO2 97%   BMI 38.77 kg/m?    ? ?Wt Readings from Last 3 Encounters:  ?02/11/22 233 lb (105.7 kg)  ?12/15/21 215 lb (97.5 kg)  ?11/18/21 222 lb (100.7 kg)  ?  ? ?GEN:  Well nourished, well developed in no acute distress ?HEENT: Normal ?NECK: No JVD; No carotid bruits ?LYMPHATICS: No lymphadenopathy ?CARDIAC: RRR, no murmurs, rubs, gallops ?RESPIRATORY:  Clear to auscultation without rales,  wheezing or rhonchi  ?ABDOMEN: Soft, non-tender, non-distended ?MUSCULOSKELETAL:  No edema; No deformity  ?SKIN: Warm and dry ?NEUROLOGIC:  Alert and oriented x 3 ?PSYCHIATRIC:  Normal affect  ? ? ?Risk Assessment/Risk Calculators:   ? ?  ?  ?   ?  ?  ? ? ?  ASSESSMENT & PLAN:   ? ? ?Postpartum Hypertension ?Chronic hypertension  ?Obesity ? ?She is doing well on the current regimen and her systolics are lower end.  What we will do to make sure that her blood pressure is not dropping we will cut back on her nifedipine to 60 mg daily and monitor the patient closely.  She will remain on the labetalol 300 mg 3 times a day with enalapril 20 mg twice a day for now.  She will take her blood pressure in the next 2 to 3 weeks and send me this information via MyChart.  We can do virtual if her medications until we see her back in 12 weeks. ? ?The patient understands the need to lose weight with diet and exercise. We have discussed specific strategies for this. ? ? ?Patient Instructions  ?Medication Instructions:  ?Your physician has recommended you make the following change in your medication:  ?DECREASE: Nifedipine 60 mg once daily ? ?Please take your blood pressure daily for 2-3 weeks and send in a MyChart message. Please include heart rates.  ? ?*If you need a refill on your cardiac medications before your next appointment, please call your pharmacy* ? ? ?Lab Work: ?None ?If you have labs (blood work) drawn today and your tests are completely normal, you will receive your results only by: ?MyChart Message (if you have MyChart) OR ?A paper copy in the mail ?If you have any lab test that is abnormal or we need to change your treatment, we will call you to review the results. ? ? ?Testing/Procedures: ?None ? ? ?Follow-Up: ?At [facility], you and your health needs are our priority.  As part of our continuing mission to provide you with exceptional heart care, we have created designated Provider Care Teams.  These Care Teams  include your primary Cardiologist (physician) and Advanced Practice Providers (APPs -  Physician Assistants and Nurse Practitioners) who all work together to provide you with the care you need, when you need i

## 2022-03-02 ENCOUNTER — Encounter: Payer: Self-pay | Admitting: Cardiology

## 2022-05-06 ENCOUNTER — Ambulatory Visit (INDEPENDENT_AMBULATORY_CARE_PROVIDER_SITE_OTHER): Payer: Commercial Managed Care - HMO | Admitting: Cardiology

## 2022-05-06 ENCOUNTER — Encounter: Payer: Self-pay | Admitting: Cardiology

## 2022-05-06 VITALS — BP 116/80 | HR 72 | Ht 65.0 in | Wt 215.0 lb

## 2022-05-06 DIAGNOSIS — I10 Essential (primary) hypertension: Secondary | ICD-10-CM | POA: Diagnosis not present

## 2022-05-06 MED ORDER — LABETALOL HCL 100 MG PO TABS: 100.0000 mg | ORAL_TABLET | Freq: Two times a day (BID) | ORAL | 2 refills | Status: DC

## 2022-05-06 NOTE — Progress Notes (Signed)
Cardio-Obstetrics Clinic  Follow Up Note   Date:  05/06/2022   ID:  Carrie Skinner, DOB 11/26/1995, MRN [id number]  PCP:  Leone Haven, MD   [place] [facility] Cardiologist:  Berniece Salines, DO  Electrophysiologist:  None        Referring MD: Leone Haven, MD   Chief Complaint: " I am   History of Present Illness:    Carrie Skinner is a 26 y.o. female [[id number]] who returns for follow up of chronic hypertension in pregnancy.   I first met the patient after she had delivered her twin girls and was in the hospital for several days due to accelerated hypertension.  She was discharged on 4 antihypertensive medications enalapril 20 mg twice a day, labetalol 300 mg 3 times daily, nifedipine 90 mg daily and hydralazine 25 mg every 8 hours.   At her last visit which was on February 11, 2022, we did back on her nifedipine to 60 mg daily.  I stopped the hydralazine.  And she remained on the labetalol and enalapril.  Her blood pressure still maintains to be less than 120 over 80 mm at home.  Prior CV Studies Reviewed: The following studies were reviewed today:  TTE 11/11/2021  1. Left ventricular ejection fraction, by estimation, is 55 to 60%. The left ventricle has normal function. The left ventricle has no regional wall motion abnormalities. There is moderate left ventricular hypertrophy. Left ventricular diastolic parameters were normal.   2. Right ventricular systolic function was not well visualized. The right ventricular size is not well visualized.   3. A small pericardial effusion is present.   4. The mitral valve is normal in structure. No evidence of mitral valve regurgitation. No evidence of mitral stenosis.   5. The aortic valve is tricuspid. Aortic valve regurgitation is not visualized. No aortic stenosis is present.   6. The inferior vena cava is normal in size with greater than 50% respiratory variability, suggesting right atrial pressure of 3 mmHg.       Past Medical History:  Diagnosis Date   Hypertension    UTI (lower urinary tract infection)     Past Surgical History:  Procedure Laterality Date   CESAREAN SECTION MULTI-GESTATIONAL N/A 11/06/2021   Procedure: CESAREAN SECTION MULTI-GESTATIONAL;  Surgeon: Servando Salina, MD;  Location: [facility];  Service: Obstetrics;  Laterality: N/A;   TONSILLECTOMY        OB History     Gravida  2   Para  1   Term      Preterm  1   AB  1   Living  2      SAB  0   IAB  1   Ectopic      Multiple  1   Live Births  2               Current Medications: Current Meds  Medication Sig   busPIRone (BUSPAR) 5 MG tablet Take 5 mg by mouth 2 (two) times daily.   enalapril (VASOTEC) 20 MG tablet Take 1 tablet (20 mg total) by mouth 2 (two) times daily.   ferrous sulfate 325 (65 FE) MG tablet Take 325 mg by mouth daily with breakfast.   ibuprofen (ADVIL) 800 MG tablet Take 1 tablet (800 mg total) by mouth every 8 (eight) hours as needed.   phentermine 37.5 MG capsule Take 37.5 mg by mouth every morning.   Prenatal Vit-Fe Fumarate-FA (PRENATAL MULTIVITAMIN) TABS tablet Take  1 tablet by mouth in the morning.   vitamin C (ASCORBIC ACID) 500 MG tablet Take 500 mg by mouth in the morning.   [DISCONTINUED] hydrALAZINE (APRESOLINE) 25 MG tablet Take 1 tablet (25 mg total) by mouth in the morning and at bedtime.   [DISCONTINUED] labetalol (NORMODYNE) 300 MG tablet Take 1 tablet (300 mg total) by mouth 3 (three) times daily.   [DISCONTINUED] NIFEdipine (PROCARDIA XL/NIFEDICAL XL) 60 MG 24 hr tablet Take 1 tablet (60 mg total) by mouth daily.     Allergies:   Patient has no known allergies.   Social History   Socioeconomic History   Marital status: Married    Spouse name: Not on file   Number of children: Not on file   Years of education: Not on file   Highest education level: Not on file  Occupational History   Not on file  Tobacco Use   Smoking status: Never    Smokeless tobacco: Never  Vaping Use   Vaping Use: Never used  Substance and Sexual Activity   Alcohol use: Not Currently    Alcohol/week: 2.0 standard drinks of alcohol    Types: 2 Cans of beer per week   Drug use: No   Sexual activity: Yes    Partners: Male  Other Topics Concern   Not on file  Social History Narrative   Patient is a Electronics engineer at [employer].   Social Determinants of Health   Financial Resource Strain: Not on file  Food Insecurity: Not on file  Transportation Needs: Not on file  Physical Activity: Not on file  Stress: Not on file  Social Connections: Not on file      Family History  Problem Relation Age of Onset   Arthritis Other    Breast cancer Other    Hypertension Other    Diabetes Other    Stroke Cousin        Early 2s      ROS:   Please see the history of present illness.     All other systems reviewed and are negative.   Labs/EKG Reviewed:    EKG:   EKG is was not ordered today.    Recent Labs: 11/07/2021: Magnesium 3.8 11/10/2021: ALT 36; B Natriuretic Peptide 21.0; BUN 8; Creatinine, Ser 0.64; Hemoglobin 10.1; Platelets 231; Potassium 3.6; Sodium 135   Recent Lipid Panel Lab Results  Component Value Date/Time   CHOL 158 06/28/2019 02:30 PM   TRIG 77.0 06/28/2019 02:30 PM   HDL 41.60 06/28/2019 02:30 PM   CHOLHDL 4 06/28/2019 02:30 PM   LDLCALC 101 (H) 06/28/2019 02:30 PM    Physical Exam:    VS:  BP 116/80 (BP Location: Left Arm, Patient Position: Sitting, Cuff Size: Normal)   Pulse 72   Ht _0  (1.651 m)   Wt 215 lb (97.5 kg)   BMI 35.78 kg/m     Wt Readings from Last 3 Encounters:  05/06/22 215 lb (97.5 kg)  02/11/22 233 lb (105.7 kg)  12/15/21 215 lb (97.5 kg)     GEN:  Well nourished, well developed in no acute distress HEENT: Normal NECK: No JVD; No carotid bruits LYMPHATICS: No lymphadenopathy CARDIAC: RRR, no murmurs, rubs, gallops RESPIRATORY:  Clear to auscultation without rales, wheezing or  rhonchi  ABDOMEN: Soft, non-tender, non-distended MUSCULOSKELETAL:  No edema; No deformity  SKIN: Warm and dry NEUROLOGIC:  Alert and oriented x 3 PSYCHIATRIC:  Normal affect    Risk Assessment/Risk Calculators:  ASSESSMENT & PLAN:    Chronic hypertension with accelerated postpartum hypertension Obesity   She is doing very well, her blood pressure has maintained less than 120/80.  We will titrate off the labetalol we will cut that back to 100 mg twice a day.  She will remain on the nifedipine to 60 mg daily and the enalapril for now. She is going to take her blood pressure for the next 4 weeks and send me this information via MyChart and we can adjust her medication as appropriate.   The patient understands the need to lose weight with diet and exercise. We have discussed specific strategies for this.   Patient Instructions  Medication Instructions:   -Decrease labetalol (normodyne) to 140m twice daily.  *If you need a refill on your cardiac medications before your next appointment, please call your pharmacy*   Follow-Up: [facility] you and your health needs are our priority.  As part of our continuing mission to provide you with exceptional heart care, we have created designated Provider Care Teams.  These Care Teams include your primary Cardiologist (physician) and Advanced Practice Providers (APPs -  Physician Assistants and Nurse Practitioners) who all work together to provide you with the care you need, when you need it.  We recommend signing up for the patient portal called "MyChart".  Sign up information is provided on this After Visit Summary.  MyChart is used to connect with patients for Virtual Visits (Telemedicine).  Patients are able to view lab/test results, encounter notes, upcoming appointments, etc.  Non-urgent messages can be sent to your provider as well.   To learn more about what you can do with MyChart, go to  [email or web address]    Your next appointment:   6 month(s)  The format for your next appointment:   In Person  Provider:   KBerniece Salines DO     Other Instructions Please keep a blood pressure log and send results in 1 month via mychart.  HOW TO TAKE YOUR BLOOD PRESSURE: Rest 5 minutes before taking your blood pressure. Don't smoke or drink caffeinated beverages for at least 30 minutes before. Take your blood pressure before (not after) you eat. Sit comfortably with your back supported and both feet on the floor (don't cross your legs). Elevate your arm to heart level on a table or a desk. Use the proper sized cuff. It should fit smoothly and snugly around your bare upper arm. There should be enough room to slip a fingertip under the cuff. The bottom edge of the cuff should be 1 inch above the crease of the elbow. Ideally, take 3 measurements at one sitting and record the average.    Dispo:  No follow-ups on file.   Medication Adjustments/Labs and Tests Ordered: Current medicines are reviewed at length with the patient today.  Concerns regarding medicines are outlined above.  Tests Ordered: No orders of the defined types were placed in this encounter.  Medication Changes: Meds ordered this encounter  Medications   DISCONTD: labetalol (NORMODYNE) 100 MG tablet    Sig: Take 1 tablet (100 mg total) by mouth 2 (two) times daily.    Dispense:  180 tablet    Refill:  2   labetalol (NORMODYNE) 100 MG tablet    Sig: Take 1 tablet (100 mg total) by mouth 2 (two) times daily.    Dispense:  180 tablet    Refill:  2

## 2022-05-06 NOTE — Patient Instructions (Signed)
Medication Instructions:   -Decrease labetalol (normodyne) to 100mg  twice daily.  *If you need a refill on your cardiac medications before your next appointment, please call your pharmacy*   Follow-Up: At [facility], you and your health needs are our priority.  As part of our continuing mission to provide you with exceptional heart care, we have created designated Provider Care Teams.  These Care Teams include your primary Cardiologist (physician) and Advanced Practice Providers (APPs -  Physician Assistants and Nurse Practitioners) who all work together to provide you with the care you need, when you need it.  We recommend signing up for the patient portal called "MyChart".  Sign up information is provided on this After Visit Summary.  MyChart is used to connect with patients for Virtual Visits (Telemedicine).  Patients are able to view lab/test results, encounter notes, upcoming appointments, etc.  Non-urgent messages can be sent to your provider as well.   To learn more about what you can do with MyChart, go to [facility].    Your next appointment:   6 month(s)  The format for your next appointment:   In Person  Provider:   [email or web address], DO     Other Instructions Please keep a blood pressure log and send results in 1 month via mychart.  HOW TO TAKE YOUR BLOOD PRESSURE: Rest 5 minutes before taking your blood pressure. Don't smoke or drink caffeinated beverages for at least 30 minutes before. Take your blood pressure before (not after) you eat. Sit comfortably with your back supported and both feet on the floor (don't cross your legs). Elevate your arm to heart level on a table or a desk. Use the proper sized cuff. It should fit smoothly and snugly around your bare upper arm. There should be enough room to slip a fingertip under the cuff. The bottom edge of the cuff should be 1 inch above the crease of the elbow. Ideally, take 3 measurements at one sitting and  record the average.

## 2022-06-04 ENCOUNTER — Encounter: Payer: Self-pay | Admitting: Cardiology

## 2022-06-13 ENCOUNTER — Encounter: Payer: Self-pay | Admitting: Family Medicine

## 2022-06-16 NOTE — Telephone Encounter (Signed)
The patient will need an in person visit for this. She has not been seen in person since 2020.

## 2022-10-26 ENCOUNTER — Encounter: Payer: Self-pay | Admitting: Cardiology

## 2022-10-26 ENCOUNTER — Ambulatory Visit: Payer: 59 | Attending: Cardiology | Admitting: Cardiology

## 2022-10-26 VITALS — BP 138/82 | HR 69 | Ht 65.5 in | Wt 205.5 lb

## 2022-10-26 DIAGNOSIS — I10 Essential (primary) hypertension: Secondary | ICD-10-CM

## 2022-10-26 DIAGNOSIS — O165 Unspecified maternal hypertension, complicating the puerperium: Secondary | ICD-10-CM

## 2022-10-26 MED ORDER — LABETALOL HCL 100 MG PO TABS: 100.0000 mg | ORAL_TABLET | Freq: Two times a day (BID) | ORAL | 2 refills | Status: DC

## 2022-10-26 MED ORDER — NIFEDIPINE ER OSMOTIC RELEASE 60 MG PO TB24: 60.0000 mg | ORAL_TABLET | Freq: Every day | ORAL | 3 refills | Status: DC

## 2022-10-26 NOTE — Progress Notes (Addendum)
Cardio-Obstetrics Clinic  Follow Up Note   Date:  10/26/2022   ID:  Carrie Skinner, DOB 1996-11-09, MRN [id number]  PCP:  Leone Haven, MD   [place] [facility] Cardiologist:  Berniece Salines, DO  Electrophysiologist:  None        Referring MD: Leone Haven, MD   Chief Complaint: " I am   History of Present Illness:    Carrie Skinner is a 26 y.o. female [[id number]] who returns for follow up of chronic hypertension in pregnancy.   I first met the patient after she had delivered her twin girls and was in the hospital for several days due to accelerated hypertension.  She was discharged on 4 antihypertensive medications enalapril 20 mg twice a day, labetalol 300 mg 3 times daily, nifedipine 90 mg daily and hydralazine 25 mg every 8 hours.   At her last visit which was on February 11, 2022, we did back on her nifedipine to 60 mg daily.  I stopped the hydralazine.  And she remained on the labetalol and enalapril.  Her blood pressure still maintains to be less than 120 over 80 mm at home.  At her visit on May 06, 2022 at that time she was maintaining her blood pressure less than 120.  We titrated off her labetalol cut that back to 100 mg twice daily she remains on nifedipine and enalapril.  She is here today for follow-up visit.  She tells me that she has missed a couple doses of her medications.  However notes that her blood pressure has been on the lower side.  She has lost some weight through exercises.  Prior CV Studies Reviewed: The following studies were reviewed today:  TTE 11/11/2021  1. Left ventricular ejection fraction, by estimation, is 55 to 60%. The left ventricle has normal function. The left ventricle has no regional wall motion abnormalities. There is moderate left ventricular hypertrophy. Left ventricular diastolic parameters were normal.   2. Right ventricular systolic function was not well visualized. The right ventricular size is not well visualized.    3. A small pericardial effusion is present.   4. The mitral valve is normal in structure. No evidence of mitral valve regurgitation. No evidence of mitral stenosis.   5. The aortic valve is tricuspid. Aortic valve regurgitation is not visualized. No aortic stenosis is present.   6. The inferior vena cava is normal in size with greater than 50% respiratory variability, suggesting right atrial pressure of 3 mmHg.      Past Medical History:  Diagnosis Date   Hypertension    UTI (lower urinary tract infection)     Past Surgical History:  Procedure Laterality Date   CESAREAN SECTION MULTI-GESTATIONAL N/A 11/06/2021   Procedure: CESAREAN SECTION MULTI-GESTATIONAL;  Surgeon: Servando Salina, MD;  Location: [facility] LD ORS;  Service: Obstetrics;  Laterality: N/A;   TONSILLECTOMY        OB History     Gravida  2   Para  1   Term      Preterm  1   AB  1   Living  2      SAB  0   IAB  1   Ectopic      Multiple  1   Live Births  2               Current Medications: Current Meds  Medication Sig   ibuprofen (ADVIL) 800 MG tablet Take 1 tablet (800 mg total)  by mouth every 8 (eight) hours as needed.   vitamin C (ASCORBIC ACID) 500 MG tablet Take 500 mg by mouth in the morning.   [DISCONTINUED] enalapril (VASOTEC) 20 MG tablet Take 1 tablet (20 mg total) by mouth 2 (two) times daily.   [DISCONTINUED] labetalol (NORMODYNE) 100 MG tablet Take 1 tablet (100 mg total) by mouth 2 (two) times daily.   [DISCONTINUED] NIFEdipine (PROCARDIA XL/NIFEDICAL XL) 60 MG 24 hr tablet Take 60 mg by mouth daily.     Allergies:   Patient has no known allergies.   Social History   Socioeconomic History   Marital status: Married    Spouse name: Not on file   Number of children: Not on file   Years of education: Not on file   Highest education level: Not on file  Occupational History   Not on file  Tobacco Use   Smoking status: Never   Smokeless tobacco: Never  Vaping Use    Vaping Use: Never used  Substance and Sexual Activity   Alcohol use: Not Currently    Alcohol/week: 2.0 standard drinks of alcohol    Types: 2 Cans of beer per week   Drug use: No   Sexual activity: Yes    Partners: Male  Other Topics Concern   Not on file  Social History Narrative   Patient is a Electronics engineer at [employer].   Social Determinants of Health   Financial Resource Strain: Not on file  Food Insecurity: Not on file  Transportation Needs: Not on file  Physical Activity: Not on file  Stress: Not on file  Social Connections: Not on file      Family History  Problem Relation Age of Onset   Arthritis Other    Breast cancer Other    Hypertension Other    Diabetes Other    Stroke Cousin        Early 72s      ROS:   Please see the history of present illness.     All other systems reviewed and are negative.   Labs/EKG Reviewed:    EKG:   EKG is was performed today.  Shows sinus rhythm, heart rate 69 bpm.  Recent Labs: 11/07/2021: Magnesium 3.8 11/10/2021: ALT 36; B Natriuretic Peptide 21.0; BUN 8; Creatinine, Ser 0.64; Hemoglobin 10.1; Platelets 231; Potassium 3.6; Sodium 135   Recent Lipid Panel Lab Results  Component Value Date/Time   CHOL 158 06/28/2019 02:30 PM   TRIG 77.0 06/28/2019 02:30 PM   HDL 41.60 06/28/2019 02:30 PM   CHOLHDL 4 06/28/2019 02:30 PM   LDLCALC 101 (H) 06/28/2019 02:30 PM    Physical Exam:    VS:  BP 138/82   Pulse 69   Ht 5' 5.5" (1.664 m)   Wt 205 lb 8 oz (93.2 kg)   SpO2 98%   BMI 33.68 kg/m     Wt Readings from Last 3 Encounters:  10/26/22 205 lb 8 oz (93.2 kg)  05/06/22 215 lb (97.5 kg)  02/11/22 233 lb (105.7 kg)     GEN:  Well nourished, well developed in no acute distress HEENT: Normal NECK: No JVD; No carotid bruits LYMPHATICS: No lymphadenopathy CARDIAC: RRR, no murmurs, rubs, gallops RESPIRATORY:  Clear to auscultation without rales, wheezing or rhonchi  ABDOMEN: Soft, non-tender,  non-distended MUSCULOSKELETAL:  No edema; No deformity  SKIN: Warm and dry NEUROLOGIC:  Alert and oriented x 3 PSYCHIATRIC:  Normal affect    Risk Assessment/Risk Calculators:  ASSESSMENT & PLAN:    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  At home she is averaging in the 120s.  What I will do is stop the enalapril.  Continue labetalol 100 mg twice a day as well as her nifedipine.  We will send refills  The patient understands the need to lose weight with diet and exercise. We have discussed specific strategies for this.  The patient is in agreement with the above plan. The patient left the office in stable condition.  The patient will follow up in 9 months or sooner if needed.  There are no Patient Instructions on file for this visit.   Dispo:  No follow-ups on file.   Medication Adjustments/Labs and Tests Ordered: Current medicines are reviewed at length with the patient today.  Concerns regarding medicines are outlined above.  Tests Ordered: Orders Placed This Encounter  Procedures   EKG 12-Lead   Medication Changes: Meds ordered this encounter  Medications   labetalol (NORMODYNE) 100 MG tablet    Sig: Take 1 tablet (100 mg total) by mouth 2 (two) times daily.    Dispense:  180 tablet    Refill:  2   NIFEdipine (PROCARDIA XL/NIFEDICAL XL) 60 MG 24 hr tablet    Sig: Take 1 tablet (60 mg total) by mouth daily.    Dispense:  90 tablet    Refill:  3

## 2022-12-09 IMAGING — US US MFM OB LIMITED
1 series · 12 of 28 positions shown · non-contrast
Comparison: none

[Series 1: us mfm ob limited · 12 of 66 slices shown]
[im 3/66]
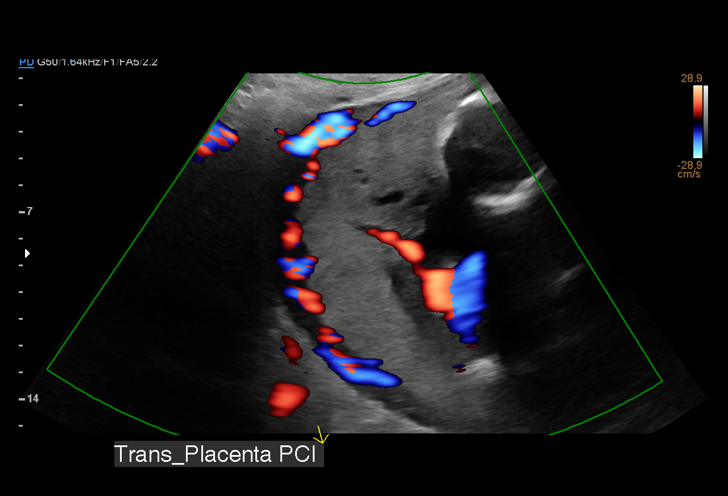
[im 8/66]
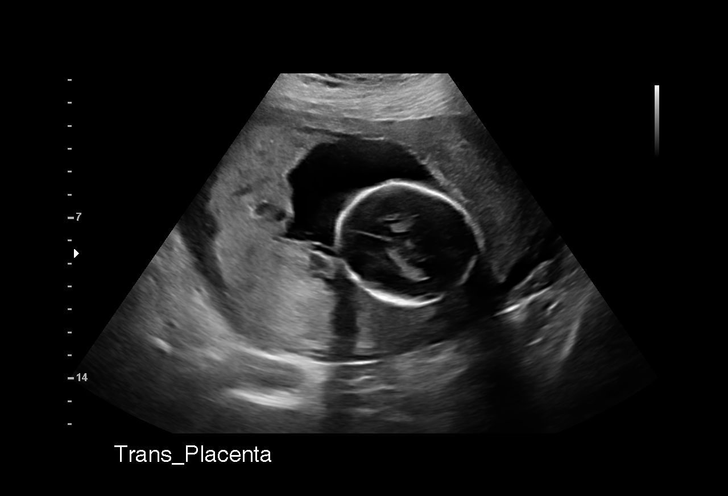
[im 13/66]
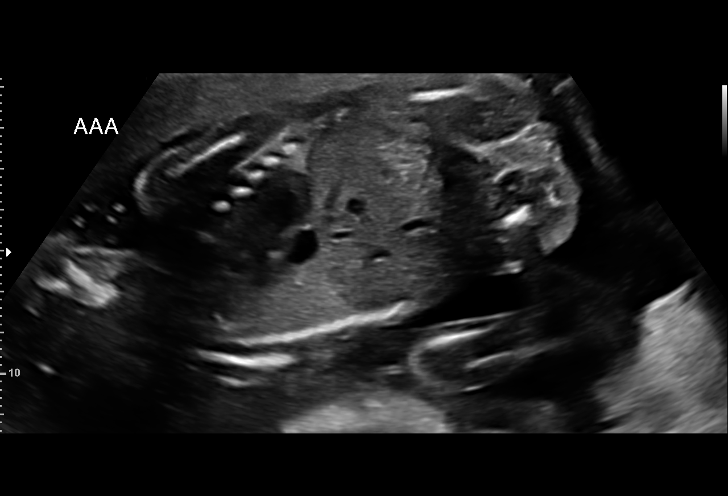
[im 20/66]
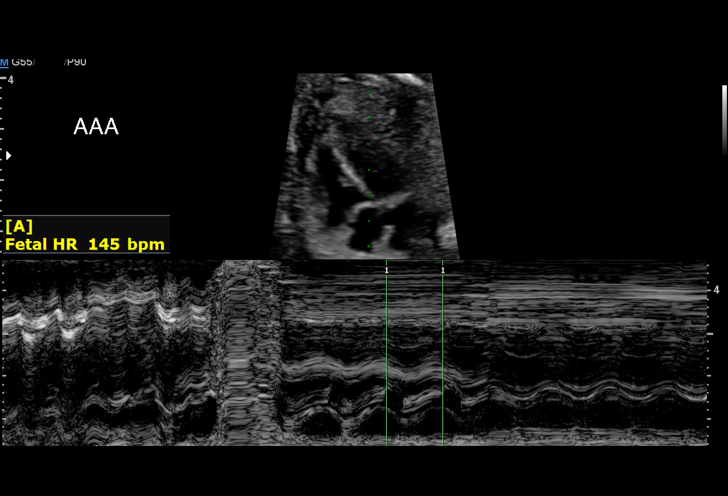
[im 25/66]
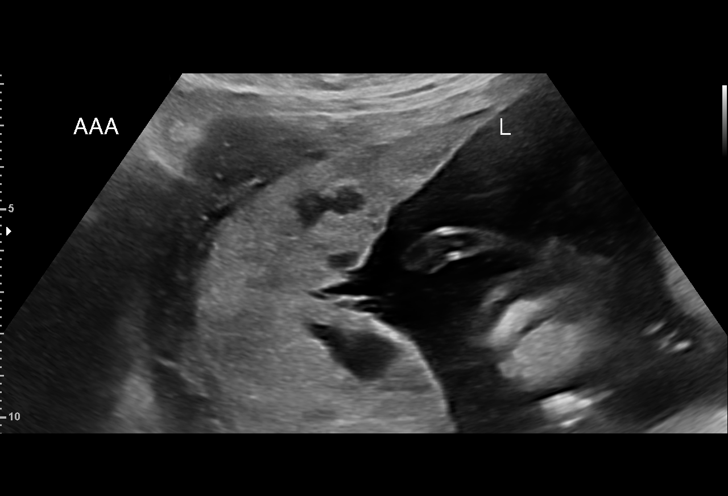
[im 29/66]
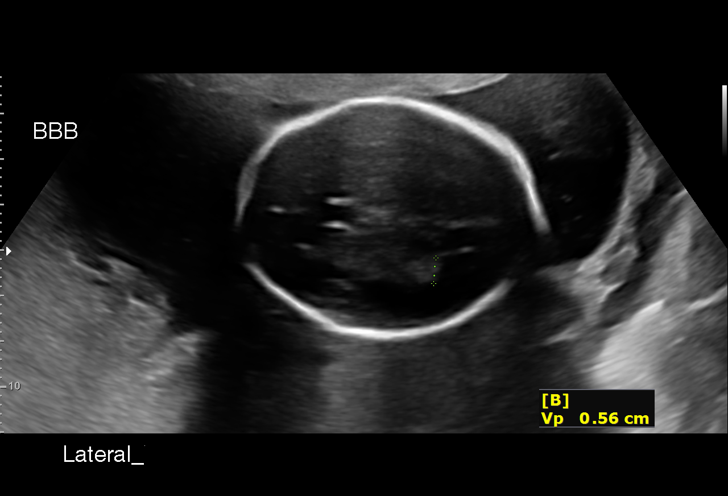
[im 37/66]
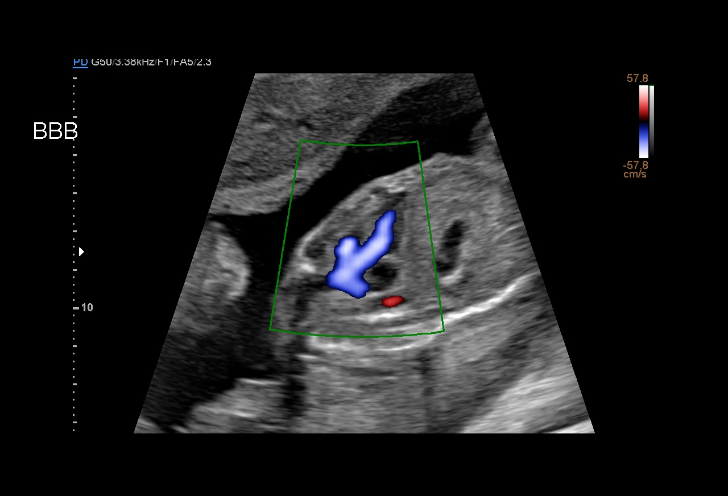
[im 41/66]
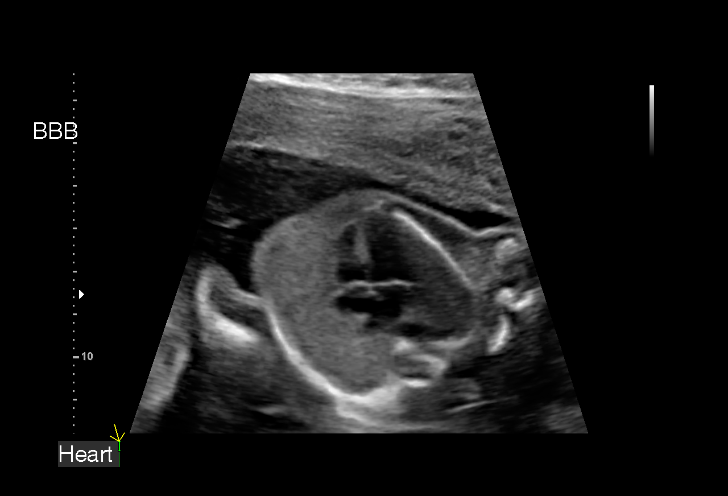
[im 46/66]
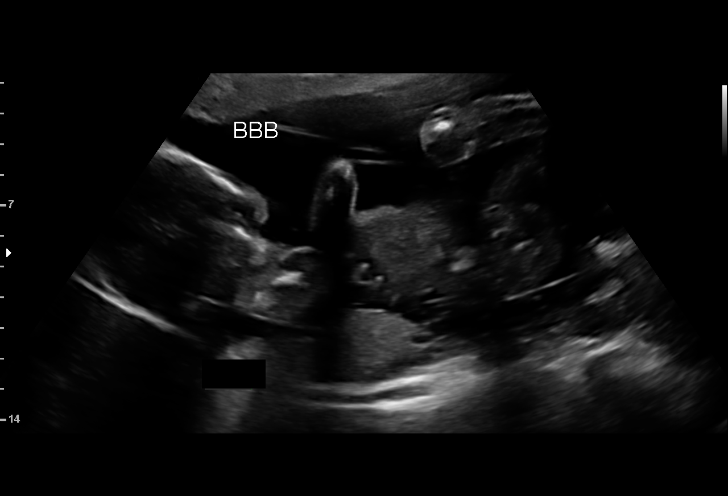
[im 53/66]
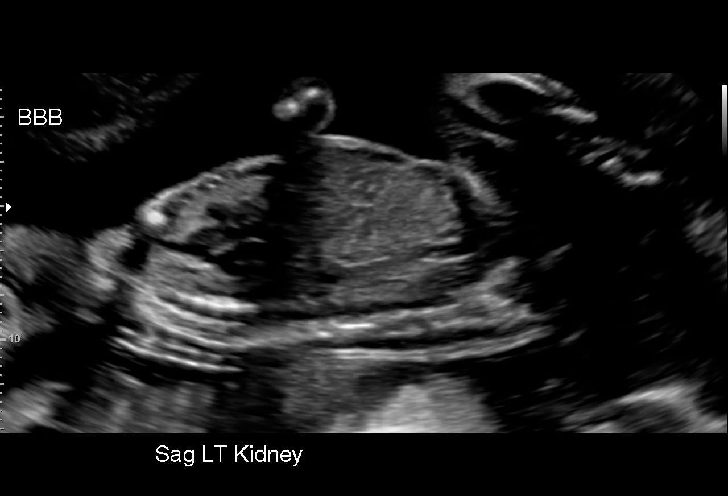
[im 58/66]
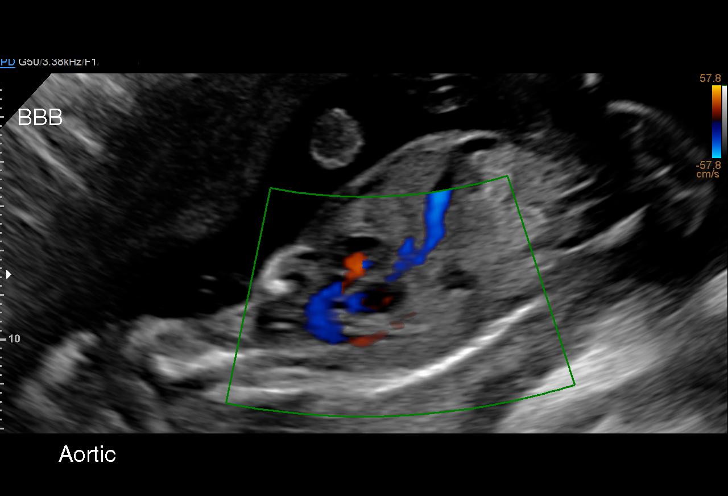
[im 63/66]
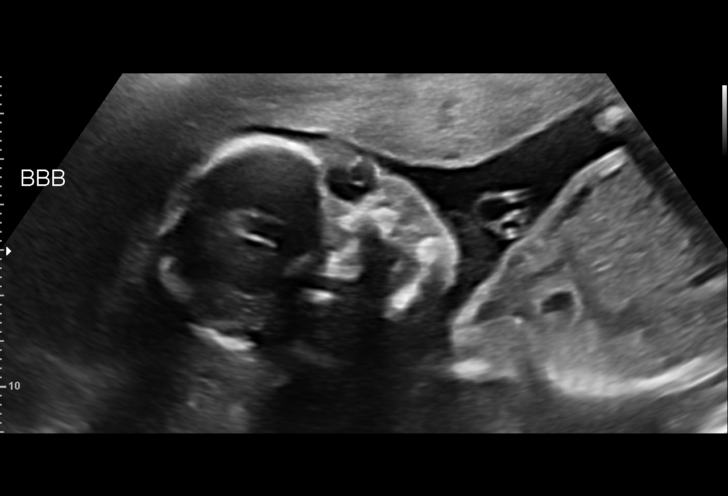

[12 of 28 positions shown; findings below may reference images not displayed]

OB/GYN

Indications

 Twin pregnancy, Cuache/Temistocle Eduardo Mendoza, second trimester
 Neg [id number]
 Encounter for antenatal screening for
 malformations
 Negative AFP
 Substance use affecting pregnancy,
 antepartum (marijuana)
 22 weeks gestation of pregnancy
Fetal Evaluation (Fetus A)

 Num Of Fetuses:         2
 Fetal Heart Rate(bpm):  145
 Cardiac Activity:       Observed
 Fetal Lie:              Lower Fetus
 Presentation:           Breech
 Placenta:               Posterior
 P. Cord Insertion:      Visualized
 Membrane Desc:      Dividing Membrane seen - Monochorionic

 Amniotic Fluid
 AFI FV:      Within normal limits

                             Largest Pocket(cm)

Biometry (Fetus A)

 LV:          3  mm
OB History

 Gravidity:    2
 TOP:          1        Living:  0
Gestational Age (Fetus A)

 LMP:           22w 3d        Date:  02/25/21                 EDD:   12/02/21
 Best:          22w 3d     Det. By:  LMP  (02/25/21)          EDD:   12/02/21
Anatomy (Fetus A)

 Cranium:               Previously seen        LVOT:                   Previously seen
 Cavum:                 Previously seen        Aortic Arch:            Previously seen
 Ventricles:            Appears normal         Ductal Arch:            Not well visualized
 Choroid Plexus:        Previously seen        Diaphragm:              Previously seen
 Cerebellum:            Previously seen        Stomach:                Previously Seen
 Posterior Fossa:       Previously seen        Abdomen:                Appears normal
 Nuchal Fold:           Not applicable (>20    Abdominal Wall:         Previously seen
                        wks GA)
 Face:                  Orbits and profile     Cord Vessels:           Previously seen
                        previously seen
 Lips:                  Appears normal         Kidneys:                Appear normal
 Palate:                Not well visualized    Bladder:                Appears normal
 Thoracic:              Appears normal         Spine:                  Previously seen
 Heart:                 Appears normal         Upper Extremities:      Previously seen
                        (4CH, axis, and
                        situs)
 RVOT:                  Previously seen        Lower Extremities:      Previously seen

 Other:  Technically difficult due to maternal habitus and fetal position. Fetus
         appears to be female.

Fetal Evaluation (Fetus B)

 Num Of Fetuses:         2
 Fetal Heart Rate(bpm):  144
 Cardiac Activity:       Observed
 Fetal Lie:              Upper Fetus
 Presentation:           Breech
 Placenta:               Posterior
 P. Cord Insertion:      Visualized
 Membrane Desc:      Dividing Membrane seen - Monochorionic

 Amniotic Fluid
 AFI FV:      Within normal limits

                             Largest Pocket(cm)

Biometry (Fetus B)
 LV:        5.6  mm
Gestational Age (Fetus B)

 LMP:           22w 3d        Date:  02/25/21                 EDD:   12/02/21
 Best:          22w 3d     Det. By:  LMP  (02/25/21)          EDD:   12/02/21
Anatomy (Fetus B)

 Cranium:               Previously seen        LVOT:                   Appears normal
 Cavum:                 Previously seen        Aortic Arch:            Appears normal
 Ventricles:            Appears normal         Ductal Arch:            Not well visualized
 Choroid Plexus:        Previously seen        Diaphragm:              Appears normal
 Cerebellum:            Previously seen        Stomach:                Appears normal, left
                                                                       sided
 Posterior Fossa:       Previously seen        Abdomen:                Previously seen
 Nuchal Fold:           Not applicable (>20    Abdominal Wall:         Previously seen
                        wks GA)
 Face:                  Orbits and profile     Cord Vessels:           Previously seen
                        previously seen
 Lips:                  Previously seen        Kidneys:                Appear normal
 Palate:                Not well visualized    Bladder:                Appears normal
 Thoracic:              Previously seen        Spine:                  Previously seen
 Heart:                 Appears normal         Upper Extremities:      Previously seen
                        (4CH, axis, and
                        situs)
 RVOT:                  Appears normal         Lower Extremities:      Previously seen

 Other:  Technically difficult due to maternal habitus and fetal position. Fetus
         appears to be female.
Cervix Uterus Adnexa

 Cervix
 Length:           4.02  cm.
 Normal appearance by transabdominal scan.

 Uterus
 No abnormality visualized.

 Right Ovary
 Not visualized.

 Left Ovary
 Not visualized.

 Cul De Sac
 No free fluid seen.

 Adnexa
 No abnormality visualized.
Impression

 Antenatal testing for monochorionic diamnoitic twin pregnancy
 Twin A normal stomach, amniotic fluid, and bladder.- Lower,
 Breech
 Twin B normal stomach, amniotic fluid, and bladder.- Upper,
 Breech
 Suboptimal views of the fetal anatomy was again seen.
Recommendations

 Continue fetal surviellance in 2 weeks for growth and
 TTTS/TAPS screening.

## 2023-02-28 IMAGING — US US MFM FETAL BPP W/O NON-STRESS
1 series · 13 of 28 positions shown · non-contrast
Comparison: none

[Series 1: us mfm fetal bpp w/o non-stress · 38 acquisitions, 13 frames shown]
[im 2/38]
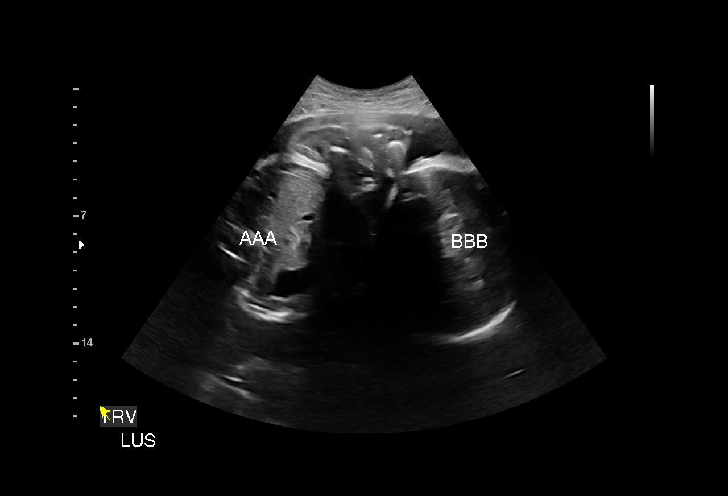
[im 5/38]
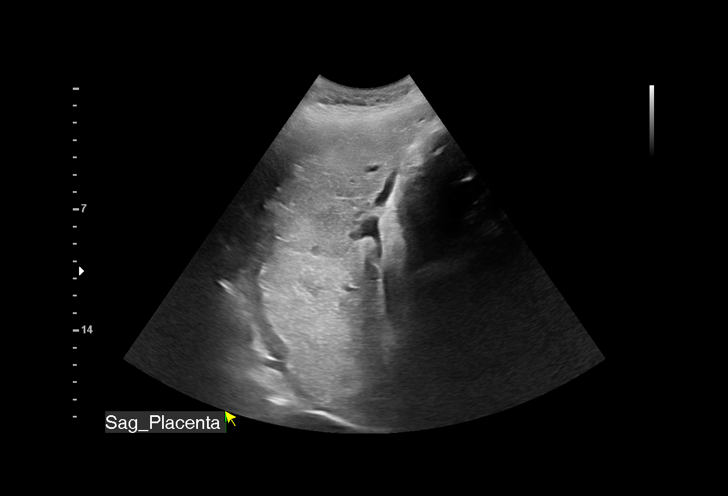
[im 7/38]
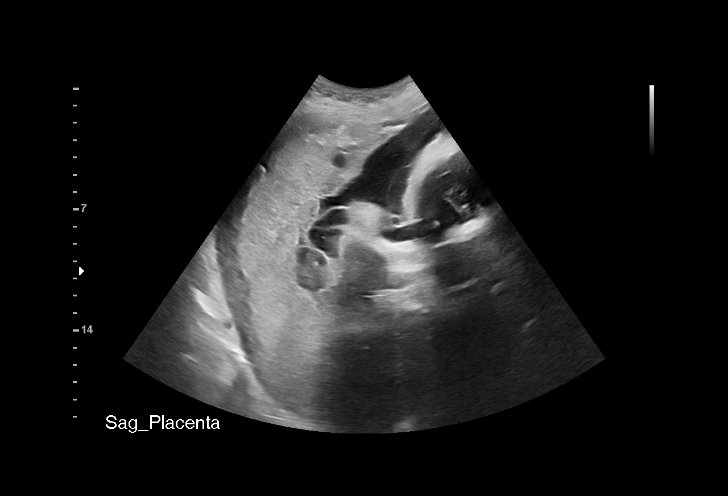
[im 10/38]
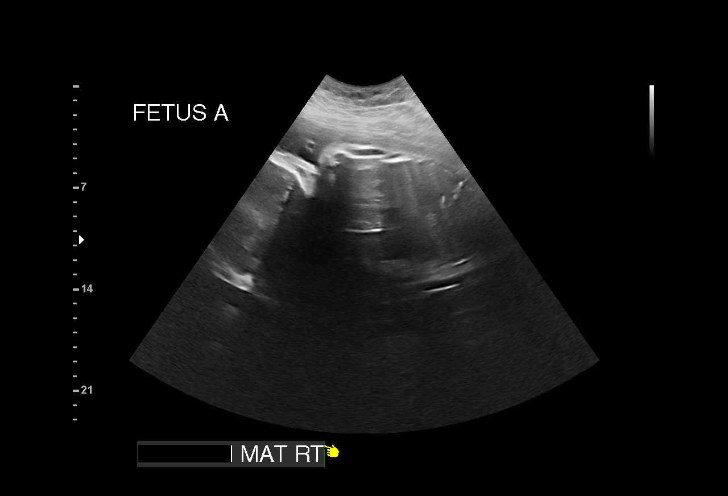
[im 13/38]
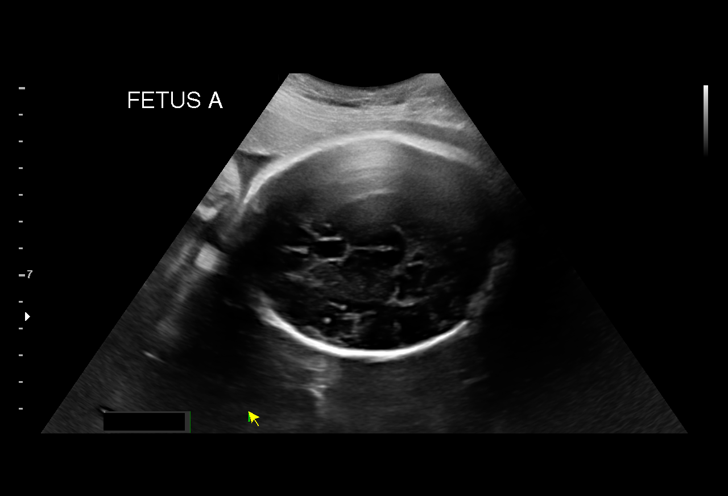
[im 16/38]
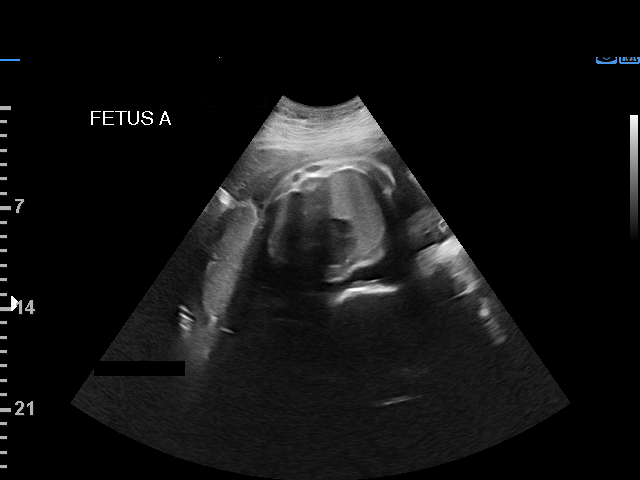
[im 20/38]
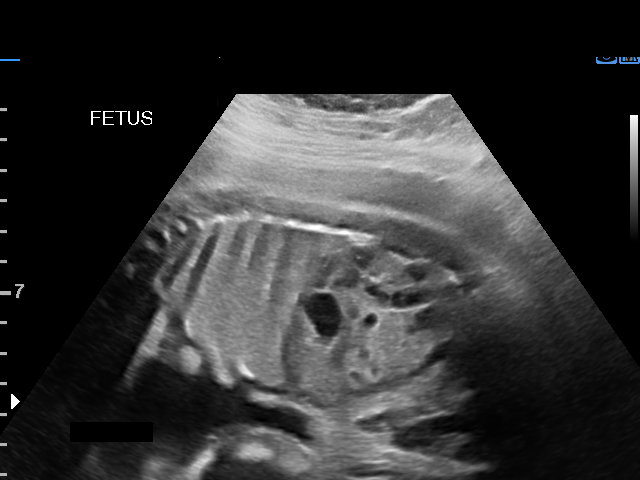
[im 22/38]
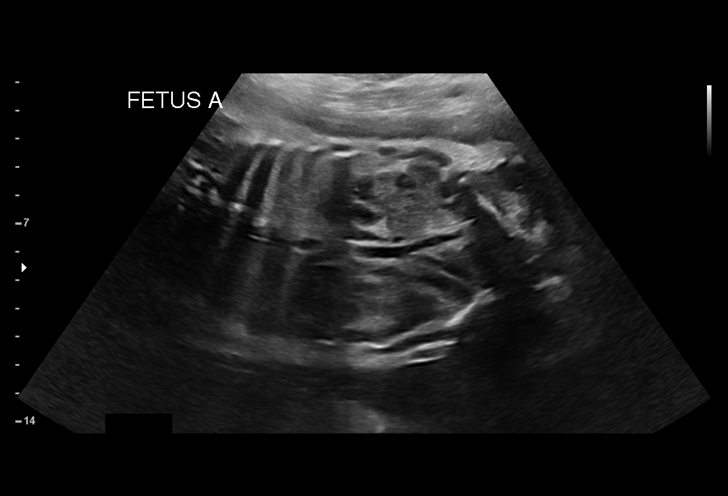
[im 25/38]
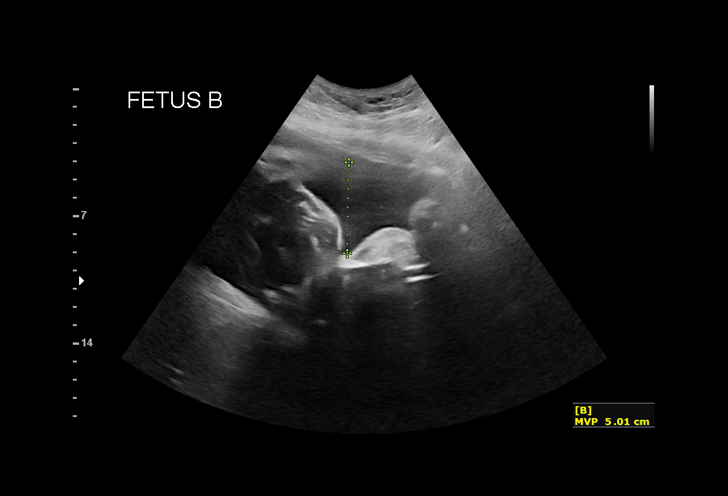
[im 28/38]
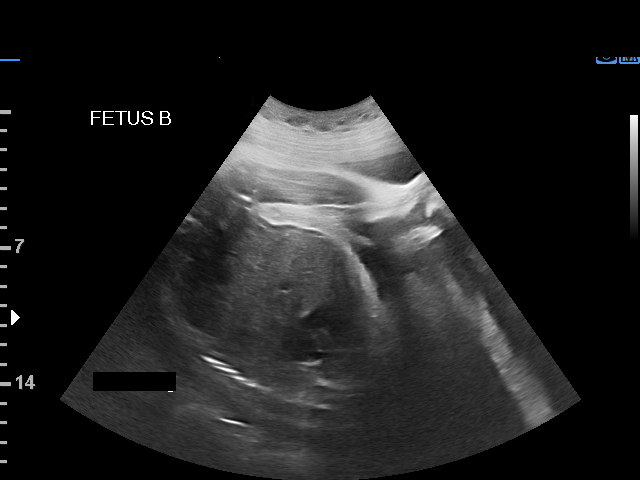
[im 31/38]
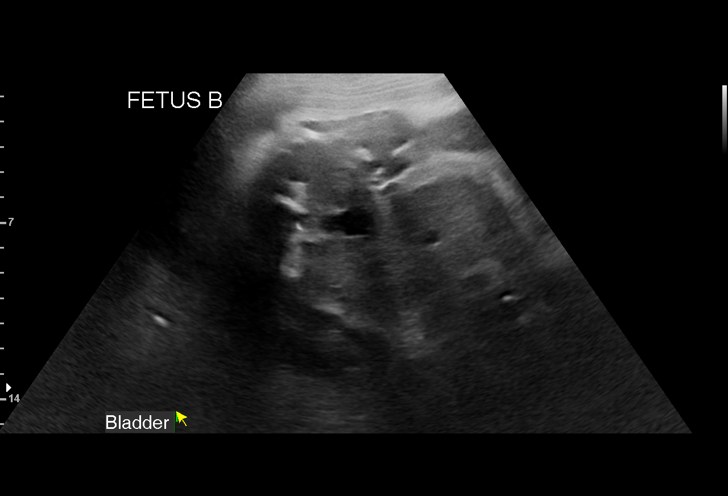
[im 33/38]
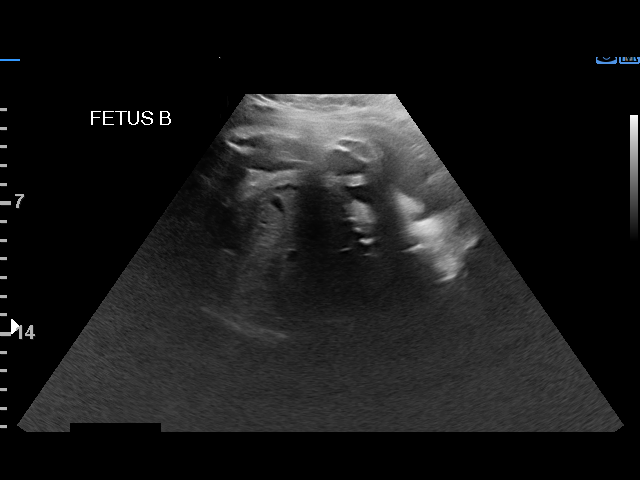
[im 36/38]
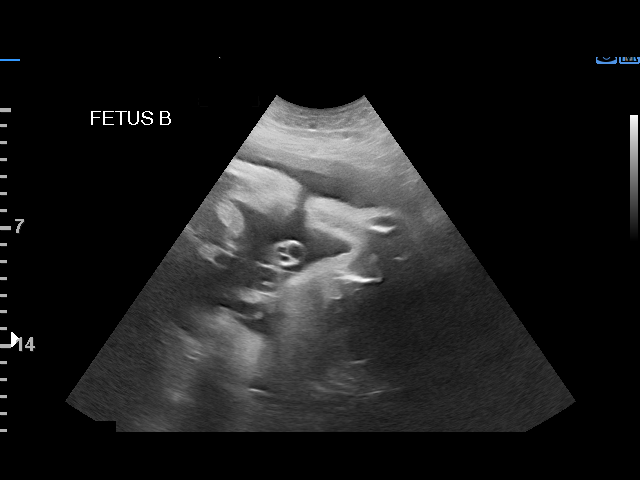

[13 of 28 positions shown; findings below may reference images not displayed]

OB/GYN

    ADDL GESTATION                                    THIJ

Indications

 Twin pregnancy, De Loera/Albertson, third trimester
 Hypertension - Chronic/Pre-existing
 Obesity complicating pregnancy, third
 trimester
 34 weeks gestation of pregnancy
 Substance use affecting pregnancy,
 antepartum (marijuana)
 Neg [id number]
 Negative AFP
Fetal Evaluation (Fetus A)

 Num Of Fetuses:         2
 Fetal Heart Rate(bpm):  153
 Cardiac Activity:       Observed
 Fetal Lie:              Lower Right Fetus
 Presentation:           Breech
 Placenta:               Fundal
 P. Cord Insertion:      Previously Visualized
 Amniotic Fluid
 AFI FV:      Within normal limits
Biophysical Evaluation (Fetus A)

 Amniotic F.V:   Within normal limits       F. Tone:        Observed
 F. Movement:    Observed                   Score:          [DATE]
 F. Breathing:   Observed
Biometry (Fetus A)

 LV:        3.8  mm
OB History

 Gravidity:    2
 TOP:          1        Living:  0
Gestational Age (Fetus A)

 LMP:           34w 0d        Date:  02/25/21                 EDD:   12/02/21
 Best:          34w 0d     Det. By:  LMP  (02/25/21)          EDD:   12/02/21
Anatomy (Fetus A)

 Cranium:               Appears normal         LVOT:                   Previously seen
 Cavum:                 Appears normal         Aortic Arch:            Previously seen
 Ventricles:            Appears normal         Ductal Arch:            Not well visualized
 Choroid Plexus:        Previously seen        Diaphragm:              Previously seen
 Cerebellum:            Previously seen        Stomach:                Appears normal, left
                                                                       sided
 Posterior Fossa:       Previously seen        Abdomen:                Previously seen
 Nuchal Fold:           Not applicable (>20    Abdominal Wall:         Previously seen
                        wks GA)
 Face:                  Orbits and profile     Cord Vessels:           Previously seen
                        previously seen
 Lips:                  Previously seen        Kidneys:                Appear normal
 Palate:                Not well visualized    Bladder:                Appears normal
 Thoracic:              Appears normal         Spine:                  Previously seen
 Heart:                 Previously seen        Upper Extremities:      Previously seen
 RVOT:                  Previously seen        Lower Extremities:      Previously seen

 Other:  Fetus previously seen to be female. Technically difficult due to
         advanced GA and fetal position.

Fetal Evaluation (Fetus B)

 Num Of Fetuses:         2
 Fetal Heart Rate(bpm):  167
 Cardiac Activity:       Observed
 Fetal Lie:              Upper Left Fetus
 Presentation:           Cephalic
 Placenta:               Fundal
 P. Cord Insertion:      Previously Visualized
 Amniotic Fluid
 AFI FV:      Within normal limits

                             Largest Pocket(cm)
                             5
Biophysical Evaluation (Fetus B)

 Amniotic F.V:   Within normal limits       F. Tone:        Observed
 F. Movement:    Observed                   Score:          [DATE]
 F. Breathing:   Observed
Biometry (Fetus B)

 LV:        3.9  mm
Gestational Age (Fetus B)

 LMP:           34w 0d        Date:  02/25/21                 EDD:   12/02/21
 Best:          34w 0d     Det. By:  LMP  (02/25/21)          EDD:   12/02/21
Anatomy (Fetus B)

 Cranium:               Appears normal         LVOT:                   Previously seen
 Cavum:                 Appears normal         Aortic Arch:            Previously seen
 Ventricles:            Appears normal         Ductal Arch:            Not well visualized
 Choroid Plexus:        Previously seen        Diaphragm:              Previously seen
 Cerebellum:            Previously seen        Stomach:                Appears normal, left
                                                                       sided
 Posterior Fossa:       Previously seen        Abdomen:                Previously seen
 Nuchal Fold:           Not applicable (>20    Abdominal Wall:         Previously seen
                        wks GA)
 Face:                  Orbits and profile     Cord Vessels:           Previously seen
                        previously seen
 Lips:                  Previously seen        Kidneys:                Previously seen
 Palate:                Not well visualized    Bladder:                Appears normal
 Thoracic:              Appears normal         Spine:                  Previously seen
 Heart:                 Previously seen        Upper Extremities:      Previously seen
 RVOT:                  Previously seen        Lower Extremities:      Previously seen

 Other:  Fetus previously seen to be female. Technically difficult due to
         advanced GA and fetal position.
Cervix Uterus Adnexa

 Cervix
 Not visualized (advanced GA >21wks)

 Uterus
 No abnormality visualized.

 Right Ovary
 Within normal limits.

 Left Ovary
 Within normal limits.
 Adnexa
 No abnormality visualized.
Impression

 Monochorionic-diamniotic twin pregnancy.  Patient return for
 antenatal testing.
 She has chronic hypertension and patient takes labetalol.
 Blood pressure today at her office is 141/87 mmHg.
 Twin A: Lower fetus, maternal right, breech presentation,
 fundal placenta.  Amniotic fluid is normal good fetal activity
 seen.  Fetal bladder is seen well.  Antenatal testing is
 reassuring.  BPP [DATE].
 Twin B: Upper fetus, maternal left, cephalic presentation,
 fundal placenta. .  Amniotic fluid is normal good fetal activity
 seen.  Fetal bladder is seen well.  Antenatal testing is
 reassuring.  BPP [DATE].
 No evidence of twin-to-twin transfusion syndrome.
Recommendations

 - Continue weekly BPP till delivery.
 -Delivery at 36 to 37 weeks gestation provider her blood
 pressures are well controlled.
                 [place], [place]

## 2023-10-10 ENCOUNTER — Other Ambulatory Visit: Payer: Self-pay | Admitting: Cardiology

## 2023-10-10 DIAGNOSIS — O165 Unspecified maternal hypertension, complicating the puerperium: Secondary | ICD-10-CM

## 2023-10-10 DIAGNOSIS — I10 Essential (primary) hypertension: Secondary | ICD-10-CM

## 2023-10-27 ENCOUNTER — Other Ambulatory Visit: Payer: Self-pay | Admitting: Cardiology

## 2023-10-27 DIAGNOSIS — I10 Essential (primary) hypertension: Secondary | ICD-10-CM

## 2023-10-27 DIAGNOSIS — O165 Unspecified maternal hypertension, complicating the puerperium: Secondary | ICD-10-CM

## 2023-12-05 ENCOUNTER — Other Ambulatory Visit: Payer: Self-pay | Admitting: Cardiology

## 2023-12-05 DIAGNOSIS — O165 Unspecified maternal hypertension, complicating the puerperium: Secondary | ICD-10-CM

## 2023-12-05 DIAGNOSIS — I10 Essential (primary) hypertension: Secondary | ICD-10-CM

## 2024-03-11 ENCOUNTER — Other Ambulatory Visit: Payer: Self-pay | Admitting: Cardiology

## 2024-03-11 DIAGNOSIS — O165 Unspecified maternal hypertension, complicating the puerperium: Secondary | ICD-10-CM

## 2024-03-11 DIAGNOSIS — I10 Essential (primary) hypertension: Secondary | ICD-10-CM

## 2024-03-13 ENCOUNTER — Other Ambulatory Visit: Payer: Self-pay | Admitting: Cardiology

## 2024-03-13 DIAGNOSIS — O165 Unspecified maternal hypertension, complicating the puerperium: Secondary | ICD-10-CM

## 2024-03-13 DIAGNOSIS — I10 Essential (primary) hypertension: Secondary | ICD-10-CM

## 2024-03-14 ENCOUNTER — Other Ambulatory Visit: Payer: Self-pay | Admitting: Cardiology

## 2024-03-14 DIAGNOSIS — O165 Unspecified maternal hypertension, complicating the puerperium: Secondary | ICD-10-CM

## 2024-03-14 DIAGNOSIS — I10 Essential (primary) hypertension: Secondary | ICD-10-CM

## 2024-04-11 ENCOUNTER — Other Ambulatory Visit: Payer: Self-pay | Admitting: Cardiology

## 2024-04-11 DIAGNOSIS — O165 Unspecified maternal hypertension, complicating the puerperium: Secondary | ICD-10-CM

## 2024-04-11 DIAGNOSIS — I10 Essential (primary) hypertension: Secondary | ICD-10-CM

## 2024-04-18 ENCOUNTER — Ambulatory Visit: Payer: Self-pay | Attending: Cardiology | Admitting: Cardiology

## 2024-04-18 ENCOUNTER — Encounter: Payer: Self-pay | Admitting: Cardiology

## 2024-04-18 VITALS — BP 160/100 | HR 60 | Ht 66.0 in | Wt 187.0 lb

## 2024-04-18 DIAGNOSIS — Z79899 Other long term (current) drug therapy: Secondary | ICD-10-CM | POA: Diagnosis not present

## 2024-04-18 DIAGNOSIS — I10 Essential (primary) hypertension: Secondary | ICD-10-CM | POA: Diagnosis not present

## 2024-04-18 DIAGNOSIS — Z131 Encounter for screening for diabetes mellitus: Secondary | ICD-10-CM | POA: Diagnosis not present

## 2024-04-18 DIAGNOSIS — Z13220 Encounter for screening for lipoid disorders: Secondary | ICD-10-CM

## 2024-04-18 DIAGNOSIS — O165 Unspecified maternal hypertension, complicating the puerperium: Secondary | ICD-10-CM

## 2024-04-18 MED ORDER — NIFEDIPINE ER OSMOTIC RELEASE 60 MG PO TB24: 60.0000 mg | ORAL_TABLET | Freq: Every day | ORAL | 3 refills | Status: DC

## 2024-04-18 MED ORDER — HYDROCHLOROTHIAZIDE 12.5 MG PO CAPS
12.5000 mg | ORAL_CAPSULE | Freq: Every day | ORAL | 3 refills | Status: AC
Start: 2024-04-18 — End: 2024-10-12

## 2024-04-18 MED ORDER — HYDROCHLOROTHIAZIDE 12.5 MG PO CAPS
12.5000 mg | ORAL_CAPSULE | Freq: Every day | ORAL | 3 refills | Status: DC
Start: 2024-04-18 — End: 2024-04-18

## 2024-04-18 NOTE — Patient Instructions (Addendum)
 Medication Instructions:  Your physician has recommended you make the following change in your medication:  STOP: Labetalol   START: Hydrochlorothiazide 12.5 mg once daily  *If you need a refill on your cardiac medications before your next appointment, please call your pharmacy*  Lab Work: CMET, Mag, Lipids, Lp(a), HgbA1c If you have labs (blood work) drawn today and your tests are completely normal, you will receive your results only by: MyChart Message (if you have MyChart) OR A paper copy in the mail If you have any lab test that is abnormal or we need to change your treatment, we will call you to review the results.  Follow-Up: At [facility], you and your health needs are our priority.  As part of our continuing mission to provide you with exceptional heart care, our providers are all part of one team.  This team includes your primary Cardiologist (physician) and Advanced Practice Providers or APPs (Physician Assistants and Nurse Practitioners) who all work together to provide you with the care you need, when you need it.  Your next appointment:   6 month(s)  Provider:   Kardie Tobb, DO     Other Instructions Please see our pharm D or an APP in 4 weeks.

## 2024-04-18 NOTE — Progress Notes (Signed)
 Cardio-Obstetrics Clinic  Follow Up Note   Date:  04/26/2024   ID:  Carrie Skinner, DOB 05/18/1996, MRN [id number]  PCP:  No primary care provider on file.   [facility] Providers Cardiologist:  Wenceslao Loper, DO  Electrophysiologist:  None        Referring MD: No ref. provider found   Chief Complaint: " I am   History of Present Illness:    Carrie Skinner is a 28 y.o. female [G2P0112] who returns for follow up of chronic hypertension in pregnancy.   Her last visit in our office was December 2023 at that time we stopped her enalapril  continue on the labetalol  100 mg twice a day as well as the nifedipine .  Discussed the use of AI scribe software for clinical note transcription with the patient, who gave verbal consent to proceed  Since her visit with me she lost her brother at the age of 24 due to acute myocardial infarction/cardiomegaly/coronary thrombosis per the autopsy report.  Her mother is under cardiology care for stress and anxiety-related hypertension. Her sister, who has lupus, is also seeing a cardiologist for elevated blood pressure related to her condition and medication.  Her home blood pressure readings were typically between 130-140/70 mmHg before she ran out of labetalol  a week ago. She is currently not on any antihypertensive medication.  Prior CV Studies Reviewed: The following studies were reviewed today:  TTE 11/11/2021  1. Left ventricular ejection fraction, by estimation, is 55 to 60%. The left ventricle has normal function. The left ventricle has no regional wall motion abnormalities. There is moderate left ventricular hypertrophy. Left ventricular diastolic parameters were normal.   2. Right ventricular systolic function was not well visualized. The right ventricular size is not well visualized.   3. A small pericardial effusion is present.   4. The mitral valve is normal in structure. No evidence of mitral valve regurgitation. No evidence  of mitral stenosis.   5. The aortic valve is tricuspid. Aortic valve regurgitation is not visualized. No aortic stenosis is present.   6. The inferior vena cava is normal in size with greater than 50% respiratory variability, suggesting right atrial pressure of 3 mmHg.    Past Medical History:  Diagnosis Date   Hypertension    UTI (lower urinary tract infection)     Past Surgical History:  Procedure Laterality Date   CESAREAN SECTION MULTI-GESTATIONAL N/A 11/06/2021   Procedure: CESAREAN SECTION MULTI-GESTATIONAL;  Surgeon: Ivery Marking, MD;  Location: [facility] LD ORS;  Service: Obstetrics;  Laterality: N/A;   TONSILLECTOMY        OB History     Gravida  2   Para  1   Term      Preterm  1   AB  1   Living  2      SAB  0   IAB  1   Ectopic      Multiple  1   Live Births  2               Current Medications: Current Meds  Medication Sig   hydrochlorothiazide  (MICROZIDE ) 12.5 MG capsule Take 1 capsule (12.5 mg total) by mouth daily.   Semaglutide-Weight Management 0.25 MG/0.5ML SOAJ Inject 0.25 mg into the skin once a week.   [DISCONTINUED] hydrochlorothiazide  (MICROZIDE ) 12.5 MG capsule Take 1 capsule (12.5 mg total) by mouth daily.   [DISCONTINUED] labetalol  (NORMODYNE ) 100 MG tablet TAKE 1 TABLET BY MOUTH TWICE A DAY  Allergies:   Patient has no known allergies.   Social History   Socioeconomic History   Marital status: Married    Spouse name: Not on file   Number of children: Not on file   Years of education: Not on file   Highest education level: Not on file  Occupational History   Not on file  Tobacco Use   Smoking status: Never   Smokeless tobacco: Never  Vaping Use   Vaping status: Never Used  Substance and Sexual Activity   Alcohol use: Not Currently    Alcohol/week: 2.0 standard drinks of alcohol    Types: 2 Cans of beer per week   Drug use: No   Sexual activity: Yes    Partners: Male  Other Topics Concern   Not on file   Social History Narrative   Patient is a Archivist at [employer].   Social Drivers of Corporate investment banker Strain: Not on file  Food Insecurity: Not on file  Transportation Needs: Not on file  Physical Activity: Not on file  Stress: Not on file  Social Connections: Not on file      Family History  Problem Relation Age of Onset   Arthritis Other    Breast cancer Other    Hypertension Other    Diabetes Other    Stroke Cousin        Early 30s      ROS:   Please see the history of present illness.     All other systems reviewed and are negative.   Labs/EKG Reviewed:    EKG:   EKG is was performed today.  Shows sinus rhythm, heart rate 60 bpm.  Recent Labs: 04/18/2024: ALT 12; BUN 12; Creatinine, Ser 0.66; Magnesium  1.9; Potassium 4.5; Sodium 142   Recent Lipid Panel Lab Results  Component Value Date/Time   CHOL 129 04/18/2024 12:00 PM   TRIG 50 04/18/2024 12:00 PM   HDL 49 04/18/2024 12:00 PM   CHOLHDL 2.6 04/18/2024 12:00 PM   CHOLHDL 4 06/28/2019 02:30 PM   LDLCALC 69 04/18/2024 12:00 PM    Physical Exam:    VS:  BP (!) 160/100 (BP Location: Right Arm, Patient Position: Sitting, Cuff Size: Normal)   Pulse 60   Ht 5\' 6"  (1.676 m)   Wt 187 lb (84.8 kg)   SpO2 99%   BMI 30.18 kg/m     Wt Readings from Last 3 Encounters:  04/18/24 187 lb (84.8 kg)  10/26/22 205 lb 8 oz (93.2 kg)  05/06/22 215 lb (97.5 kg)     GEN:  Well nourished, well developed in no acute distress HEENT: Normal NECK: No JVD; No carotid bruits LYMPHATICS: No lymphadenopathy CARDIAC: RRR, no murmurs, rubs, gallops RESPIRATORY:  Clear to auscultation without rales, wheezing or rhonchi  ABDOMEN: Soft, non-tender, non-distended MUSCULOSKELETAL:  No edema; No deformity  SKIN: Warm and dry NEUROLOGIC:  Alert and oriented x 3 PSYCHIATRIC:  Normal affect    Risk Assessment/Risk Calculators:                 ASSESSMENT & PLAN:    Chronic hypertension with  accelerated postpartum hypertension Obesity  Hypertension Blood pressure somewhat controlled with labetalol , but readings 130-140/70. Plan to change medication for better control and future pregnancy safety. - Discontinue labetalol . - Initiate hydrochlorothiazide  12.5 mg. - Transition from nifedipine  to amlodipine 5 mg hopefully by her next visit - Schedule follow-up with pharmacy team in 4 weeks to assess medication  efficacy. - Monitor blood pressure at home and report readings if no follow-up for 3 months. - Plan to see provider in 6 months for further evaluation.  Family history of Premature heart disease Significant family history due to brother's death from heart attack at 28. Need for aggressive cardiovascular risk management and further information from autopsy. - Obtain autopsy report to determine cause of brother's heart attack - Order LP(a) test to assess cardiovascular risk. - Order lipid panel. - Screen for diabetes. - Discuss potential need for coronary CTA or calcium score closer to age 26. - Advise to report any symptoms such as shortness of breath or palpitations for further evaluatio   The patient understands the need to lose weight with diet and exercise. We have discussed specific strategies for this.  The patient is in agreement with the above plan. The patient left the office in stable condition.  The patient will follow up in 6 months or sooner if needed.  Patient Instructions  Medication Instructions:  Your physician has recommended you make the following change in your medication:  STOP: Labetalol   START: Hydrochlorothiazide  12.5 mg once daily  *If you need a refill on your cardiac medications before your next appointment, please call your pharmacy*  Lab Work: CMET, Mag, Lipids, Lp(a), HgbA1c If you have labs (blood work) drawn today and your tests are completely normal, you will receive your results only by: MyChart Message (if you have MyChart) OR A  paper copy in the mail If you have any lab test that is abnormal or we need to change your treatment, we will call you to review the results.  Follow-Up: At [facility], you and your health needs are our priority.  As part of our continuing mission to provide you with exceptional heart care, our providers are all part of one team.  This team includes your primary Cardiologist (physician) and Advanced Practice Providers or APPs (Physician Assistants and Nurse Practitioners) who all work together to provide you with the care you need, when you need it.  Your next appointment:   6 month(s)  Provider:   Kera Deacon, DO     Other Instructions Please see our pharm D or an APP in 4 weeks.        Dispo:  No follow-ups on file.   Medication Adjustments/Labs and Tests Ordered: Current medicines are reviewed at length with the patient today.  Concerns regarding medicines are outlined above.  Tests Ordered: Orders Placed This Encounter  Procedures   Comp Met (CMET)   Magnesium    Lipid panel   Lipoprotein A (LPA)   Hemoglobin A1c   AMB Referral to [facility] Pharm-D   EKG 12-Lead   Medication Changes: Meds ordered this encounter  Medications   NIFEdipine  (PROCARDIA  XL/NIFEDICAL XL) 60 MG 24 hr tablet    Sig: Take 1 tablet (60 mg total) by mouth daily.    Dispense:  90 tablet    Refill:  3   DISCONTD: hydrochlorothiazide  (MICROZIDE ) 12.5 MG capsule    Sig: Take 1 capsule (12.5 mg total) by mouth daily.    Dispense:  90 capsule    Refill:  3   hydrochlorothiazide  (MICROZIDE ) 12.5 MG capsule    Sig: Take 1 capsule (12.5 mg total) by mouth daily.    Dispense:  90 capsule    Refill:  3

## 2024-04-19 LAB — LIPID PANEL
Chol/HDL Ratio: 2.6 ratio (ref 0.0–4.4)
Cholesterol, Total: 129 mg/dL (ref 100–199)
HDL: 49 mg/dL (ref >39)
LDL Chol Calc (NIH): 69 mg/dL (ref 0–99)
Triglycerides: 50 mg/dL (ref 0–149)
VLDL Cholesterol Cal: 11 mg/dL (ref 5–40)

## 2024-04-19 LAB — COMPREHENSIVE METABOLIC PANEL WITH GFR
ALT: 12 IU/L (ref 0–32)
AST: 19 IU/L (ref 0–40)
Albumin: 4.1 g/dL (ref 4.0–5.0)
Alkaline Phosphatase: 46 IU/L (ref 44–121)
BUN/Creatinine Ratio: 18 (ref 9–23)
BUN: 12 mg/dL (ref 6–20)
Bilirubin Total: 0.4 mg/dL (ref 0.0–1.2)
CO2: 20 mmol/L (ref 20–29)
Calcium: 9.1 mg/dL (ref 8.7–10.2)
Chloride: 105 mmol/L (ref 96–106)
Creatinine, Ser: 0.66 mg/dL (ref 0.57–1.00)
Globulin, Total: 1.9 g/dL (ref 1.5–4.5)
Glucose: 77 mg/dL (ref 70–99)
Potassium: 4.5 mmol/L (ref 3.5–5.2)
Sodium: 142 mmol/L (ref 134–144)
Total Protein: 6 g/dL (ref 6.0–8.5)
eGFR: 122 mL/min/{1.73_m2} (ref >59)

## 2024-04-19 LAB — HEMOGLOBIN A1C
Est. average glucose Bld gHb Est-mCnc: 97 mg/dL
Hgb A1c MFr Bld: 5 % (ref 4.8–5.6)

## 2024-04-19 LAB — LIPOPROTEIN A (LPA): Lipoprotein (a): 37.1 nmol/L (ref <75.0)

## 2024-04-19 LAB — MAGNESIUM: Magnesium: 1.9 mg/dL (ref 1.6–2.3)

## 2024-04-24 ENCOUNTER — Ambulatory Visit: Payer: Self-pay | Admitting: Cardiology

## 2024-04-27 NOTE — Telephone Encounter (Signed)
 Seen by patient Carrie Skinner on 04/24/2024  3:21 PM

## 2024-05-16 ENCOUNTER — Ambulatory Visit: Attending: Nurse Practitioner | Admitting: Nurse Practitioner

## 2024-05-16 ENCOUNTER — Encounter: Payer: Self-pay | Admitting: Nurse Practitioner

## 2024-05-16 VITALS — BP 122/82 | HR 67 | Ht 66.0 in | Wt 184.4 lb

## 2024-05-16 DIAGNOSIS — I10 Essential (primary) hypertension: Secondary | ICD-10-CM

## 2024-05-16 DIAGNOSIS — Z8249 Family history of ischemic heart disease and other diseases of the circulatory system: Secondary | ICD-10-CM

## 2024-05-16 NOTE — Patient Instructions (Signed)
 Medication Instructions:  Your physician recommends that you continue on your current medications as directed.  *If you need a refill on your cardiac medications before your next appointment, please call your pharmacy*  Lab Work: NONE ordered at this time of appointment    Testing/Procedures: NONE ordered at this time of appointment    Follow-Up: At [facility], you and your health needs are our priority.  As part of our continuing mission to provide you with exceptional heart care, our providers are all part of one team.  This team includes your primary Cardiologist (physician) and Advanced Practice Providers or APPs (Physician Assistants and Nurse Practitioners) who all work together to provide you with the care you need, when you need it.  Your next appointment:    November 2025   Provider:   Kardie Tobb, DO    We recommend signing up for the patient portal called MyChart.  Sign up information is provided on this After Visit Summary.  MyChart is used to connect with patients for Virtual Visits (Telemedicine).  Patients are able to view lab/test results, encounter notes, upcoming appointments, etc.  Non-urgent messages can be sent to your provider as well.   To learn more about what you can do with MyChart, go to [email or web address].

## 2024-05-16 NOTE — Progress Notes (Signed)
 Office Visit    Patient Name: Carrie Skinner Date of Encounter: 05/16/2024  Primary Care Provider:  No primary care provider on file. Primary Cardiologist:  Kardie Tobb, DO  Chief Complaint    28 year old female with a history of hypertension in pregnancy, family history of premature heart disease, and obesity who presents for follow-up related to hypertension.  Past Medical History    Past Medical History:  Diagnosis Date   Hypertension    UTI (lower urinary tract infection)    Past Surgical History:  Procedure Laterality Date   CESAREAN SECTION MULTI-GESTATIONAL N/A 11/06/2021   Procedure: CESAREAN SECTION MULTI-GESTATIONAL;  Surgeon: Rutherford Gain, MD;  Location: [facility];  Service: Obstetrics;  Laterality: N/A;   TONSILLECTOMY      Allergies  No Known Allergies   Labs/Other Studies Reviewed    The following studies were reviewed today:  Cardiac Studies & Procedures   ______________________________________________________________________________________________     ECHOCARDIOGRAM  ECHOCARDIOGRAM COMPLETE 11/10/2021  Narrative ECHOCARDIOGRAM REPORT    Patient Name:   Carrie Skinner Date of Exam: 11/10/2021 Medical Rec #:  [id number]       Height:       65.0 in Accession #:    [id number]      Weight:       255.1 lb Date of Birth:  07/08/1996       BSA:          2.194 m Patient Age:    25 years        BP:           159/107 mmHg Patient Gender: F               HR:           78 bpm. Exam Location:  Inpatient  Procedure: 2D Echo, Color Doppler and Cardiac Doppler  Indications:    I10 Hypertension  History:        Patient has no prior history of Echocardiogram examinations. Risk Factors:Hypertension.  Sonographer:    Damien Senior RDCS Referring Phys: [id number] KARDIE TOBB   Sonographer Comments: 4 days post-partum (twins) IMPRESSIONS   1. Left ventricular ejection fraction, by estimation, is 55 to 60%. The left ventricle has normal  function. The left ventricle has no regional wall motion abnormalities. There is moderate left ventricular hypertrophy. Left ventricular diastolic parameters were normal. 2. Right ventricular systolic function was not well visualized. The right ventricular size is not well visualized. 3. A small pericardial effusion is present. 4. The mitral valve is normal in structure. No evidence of mitral valve regurgitation. No evidence of mitral stenosis. 5. The aortic valve is tricuspid. Aortic valve regurgitation is not visualized. No aortic stenosis is present. 6. The inferior vena cava is normal in size with greater than 50% respiratory variability, suggesting right atrial pressure of 3 mmHg.  FINDINGS Left Ventricle: Left ventricular ejection fraction, by estimation, is 55 to 60%. The left ventricle has normal function. The left ventricle has no regional wall motion abnormalities. The left ventricular internal cavity size was normal in size. There is moderate left ventricular hypertrophy. Left ventricular diastolic parameters were normal.  Right Ventricle: The right ventricular size is not well visualized. Right vetricular wall thickness was not well visualized. Right ventricular systolic function was not well visualized.  Left Atrium: Left atrial size was normal in size.  Right Atrium: Right atrial size was normal in size.  Pericardium: A small pericardial effusion is present.  Mitral Valve:  The mitral valve is normal in structure. No evidence of mitral valve regurgitation. No evidence of mitral valve stenosis.  Tricuspid Valve: The tricuspid valve is normal in structure. Tricuspid valve regurgitation is trivial.  Aortic Valve: The aortic valve is tricuspid. Aortic valve regurgitation is not visualized. No aortic stenosis is present.  Pulmonic Valve: The pulmonic valve was not well visualized. Pulmonic valve regurgitation is not visualized.  Aorta: The aortic root and ascending aorta are  structurally normal, with no evidence of dilitation.  Venous: The inferior vena cava is normal in size with greater than 50% respiratory variability, suggesting right atrial pressure of 3 mmHg.  IAS/Shunts: The interatrial septum was not well visualized.   LEFT VENTRICLE PLAX 2D LVIDd:         4.50 cm   Diastology LVIDs:         3.30 cm   LV e' medial:    9.36 cm/s LV PW:         1.40 cm   LV E/e' medial:  7.0 LV IVS:        1.00 cm   LV e' lateral:   10.20 cm/s LVOT diam:     2.10 cm   LV E/e' lateral: 6.4 LV SV:         62 LV SV Index:   28 LVOT Area:     3.46 cm   RIGHT VENTRICLE RV S prime:     8.81 cm/s TAPSE (M-mode): 2.1 cm  LEFT ATRIUM             Index        RIGHT ATRIUM           Index LA diam:        3.90 cm 1.78 cm/m   RA Area:     20.00 cm LA Vol (A2C):   48.6 ml 22.15 ml/m  RA Volume:   60.60 ml  27.62 ml/m LA Vol (A4C):   79.6 ml 36.29 ml/m LA Biplane Vol: 67.7 ml 30.86 ml/m AORTIC VALVE LVOT Vmax:   94.60 cm/s LVOT Vmean:  72.500 cm/s LVOT VTI:    0.178 m  AORTA Ao Root diam: 2.90 cm Ao Asc diam:  2.80 cm  MITRAL VALVE MV Area (PHT): 4.08 cm    SHUNTS MV Decel Time: 186 msec    Systemic VTI:  0.18 m MV E velocity: 65.60 cm/s  Systemic Diam: 2.10 cm MV A velocity: 47.10 cm/s MV E/A ratio:  1.39  Lonni Nanas MD Electronically signed by Lonni Nanas MD Signature Date/Time: 11/10/2021/2:35:24 PM    Final          ______________________________________________________________________________________________     Recent Labs: 04/18/2024: ALT 12; BUN 12; Creatinine, Ser 0.66; Magnesium  1.9; Potassium 4.5; Sodium 142  Recent Lipid Panel    Component Value Date/Time   CHOL 129 04/18/2024 1200   TRIG 50 04/18/2024 1200   HDL 49 04/18/2024 1200   CHOLHDL 2.6 04/18/2024 1200   CHOLHDL 4 06/28/2019 1430   VLDL 15.4 06/28/2019 1430   LDLCALC 69 04/18/2024 1200    History of Present Illness    28 year old female with  the above past medical history including  hypertension in pregnancy, family history of premature heart disease, and obesity.  She has a history of postpartum hypertension and has followed with Dr. Sheena in this setting.  She also has a family history of premature CAD, brother died of a heart attack at the age of 24.  Echocardiogram in 2022  showed EF 55 to 60%, normal LV function, no RWMA, moderate LVH, normal RV systolic function, small pericardial effusion, no significant valvular abnormalities.  She was last seen in the office on 04/18/2024.  BP remained elevated.  Labetalol  was discontinued.  She was started on hydrochlorothiazide .  Labs 03/2024 including lipid panel, LP(a), and hemoglobin A1c were normal.  She presents today for follow-up.  Since her last visit she has done well from a cardiac standpoint.  She has noted intermittently low BP, though BP has denied and well-controlled.  She denies any palpitations, dizziness, presyncope, syncope, denies symptoms concerning for angina.  She works from home, her twin girls are 62-1/30 years old.  She shares that she is a twin herself (she has a twin sister).  Overall, she reports feeling well.   Home Medications    Current Outpatient Medications  Medication Sig Dispense Refill   hydrochlorothiazide  (MICROZIDE ) 12.5 MG capsule Take 1 capsule (12.5 mg total) by mouth daily. 90 capsule 3   Semaglutide-Weight Management 0.25 MG/0.5ML SOAJ Inject 0.25 mg into the skin once a week.     No current facility-administered medications for this visit.     Review of Systems    She denies chest pain, palpitations, dyspnea, pnd, orthopnea, n, v, dizziness, syncope, edema, weight gain, or early satiety. All other systems reviewed and are otherwise negative except as noted above.   Physical Exam    VS:  BP 122/82 (BP Location: Left Arm, Patient Position: Sitting)   Pulse 67   Ht 5' 6 (1.676 m)   Wt 184 lb 6.4 oz (83.6 kg)   SpO2 98%   BMI 29.76 kg/m  GEN:  Well nourished, well developed, in no acute distress. HEENT: normal. Neck: Supple, no JVD, carotid bruits, or masses. Cardiac: RRR, no murmurs, rubs, or gallops. No clubbing, cyanosis, edema.  Radials/DP/PT 2+ and equal bilaterally.  Respiratory:  Respirations regular and unlabored, clear to auscultation bilaterally. GI: Soft, nontender, nondistended, BS + x 4. MS: no deformity or atrophy. Skin: warm and dry, no rash. Neuro:  Strength and sensation are intact. Psych: Normal affect.  Accessory Clinical Findings    ECG personally reviewed by me today -    - no EKG in office today.   Lab Results  Component Value Date   WBC 11.5 (H) 11/10/2021   HGB 10.1 (L) 11/10/2021   HCT 31.1 (L) 11/10/2021   MCV 85.7 11/10/2021   PLT 231 11/10/2021   Lab Results  Component Value Date   CREATININE 0.66 04/18/2024   BUN 12 04/18/2024   NA 142 04/18/2024   K 4.5 04/18/2024   CL 105 04/18/2024   CO2 20 04/18/2024   Lab Results  Component Value Date   ALT 12 04/18/2024   AST 19 04/18/2024   ALKPHOS 46 04/18/2024   BILITOT 0.4 04/18/2024   Lab Results  Component Value Date   CHOL 129 04/18/2024   HDL 49 04/18/2024   LDLCALC 69 04/18/2024   TRIG 50 04/18/2024   CHOLHDL 2.6 04/18/2024    Lab Results  Component Value Date   HGBA1C 5.0 04/18/2024    Assessment & Plan   1. Hypertension: BP well controlled. Continue current antihypertensive regimen.   2. Family history of premature heart disease: Her brother died of a heart attack at the age of 44.  Echocardiogram in 2022 showed EF 55 to 60%, normal LV function, no RWMA, moderate LVH, normal RV systolic function, small pericardial effusion, no significant valvular abnormalities.  Recent labs in 03/2024 including lipid panel, LP(a), and hemoglobin A1c were normal.  Stable with no anginal symptoms. No indication for ischemic evaluation.  3. Obesity: Previously on semaglutide, however she recently stopped. Encouraged ongoing lifestyle  modifications with diet and exercise.  4. Disposition: Follow-up per recall with Dr. Sheena in 09/2024.       Damien JAYSON Braver, NP 05/16/2024, 12:43 PM

## 2024-10-10 NOTE — Telephone Encounter (Signed)
 Open in error

## 2024-10-11 ENCOUNTER — Encounter: Payer: Self-pay | Admitting: *Deleted

## 2024-10-12 ENCOUNTER — Ambulatory Visit: Admitting: Cardiology

## 2024-10-12 ENCOUNTER — Encounter: Payer: Self-pay | Admitting: Cardiology

## 2024-10-12 ENCOUNTER — Ambulatory Visit: Attending: Cardiology | Admitting: Cardiology

## 2024-10-12 VITALS — BP 128/87 | HR 64 | Resp 16 | Ht 66.0 in | Wt 175.0 lb

## 2024-10-12 DIAGNOSIS — I10 Essential (primary) hypertension: Secondary | ICD-10-CM

## 2024-10-12 DIAGNOSIS — Z79899 Other long term (current) drug therapy: Secondary | ICD-10-CM

## 2024-10-12 NOTE — Progress Notes (Signed)
 Cardio[facility]Obstetrics Clinic  Follow Up Note   Date:  10/12/2024   ID:  Carrie Skinner, DOB [birth year], MRN [id number]  PCP:  Pcp, No   [facility] Providers Cardiologist:  Dub Huntsman, DO  Electrophysiologist:  None        Referring MD: No ref. provider found   Chief Complaint:  I am   History of Present Illness:    Carrie Skinner is a 28 y.o. female [G2P0112] who returns for follow chronic hypertension.  Since her last visit with me she tells me she has been doing well.  She offers no significant complaints at this time.  She is enjoying her studies.  Her twin daughters are doing well.  She has lost some weight and she is excited about this.   Prior CV Studies Reviewed: The following studies were reviewed today:  TTE 11/11/2021  1. Left ventricular ejection fraction, by estimation, is 55 to 60%. The left ventricle has normal function. The left ventricle has no regional wall motion abnormalities. There is moderate left ventricular hypertrophy. Left ventricular diastolic parameters were normal.   2. Right ventricular systolic function was not well visualized. The right ventricular size is not well visualized.   3. A small pericardial effusion is present.   4. The mitral valve is normal in structure. No evidence of mitral valve regurgitation. No evidence of mitral stenosis.   5. The aortic valve is tricuspid. Aortic valve regurgitation is not visualized. No aortic stenosis is present.   6. The inferior vena cava is normal in size with greater than 50% respiratory variability, suggesting right atrial pressure of 3 mmHg.    Past Medical History:  Diagnosis Date   Hypertension    UTI (lower urinary tract infection)     Past Surgical History:  Procedure Laterality Date   CESAREAN SECTION MULTI-GESTATIONAL N/A 11/06/2021   Procedure: CESAREAN SECTION MULTI-GESTATIONAL;  Surgeon: Rutherford Gain, MD;  Location: [facility];  Service: Obstetrics;  Laterality:  N/A;   TONSILLECTOMY        OB History     Gravida  2   Para  1   Term      Preterm  1   AB  1   Living  2      SAB  0   IAB  1   Ectopic      Multiple  1   Live Births  2               Current Medications: Current Meds  Medication Sig   hydrochlorothiazide  (MICROZIDE ) 12.5 MG capsule Take 1 capsule (12.5 mg total) by mouth daily.   Semaglutide-Weight Management 0.25 MG/0.5ML SOAJ Inject 0.25 mg into the skin once a week.     Allergies:   Patient has no known allergies.   Social History   Socioeconomic History   Marital status: Married    Spouse name: Not on file   Number of children: Not on file   Years of education: Not on file   Highest education level: Not on file  Occupational History   Not on file  Tobacco Use   Smoking status: Never   Smokeless tobacco: Never  Vaping Use   Vaping status: Never Used  Substance and Sexual Activity   Alcohol use: Not Currently    Alcohol/week: 2.0 standard drinks of alcohol    Types: 2 Cans of beer per week   Drug use: No   Sexual activity: Yes    Partners:  Male  Other Topics Concern   Not on file  Social History Narrative   Patient is a archivist at [employer].   Social Drivers of Corporate Investment Banker Strain: Not on file  Food Insecurity: Not on file  Transportation Needs: Not on file  Physical Activity: Not on file  Stress: Not on file  Social Connections: Not on file      Family History  Problem Relation Age of Onset   Arthritis Other    Breast cancer Other    Hypertension Other    Diabetes Other    Stroke Cousin        Early 30s      ROS:   Please see the history of present illness.     All other systems reviewed and are negative.   Labs/EKG Reviewed:    EKG:  none today   Recent Labs: 04/18/2024: ALT 12; BUN 12; Creatinine, Ser 0.66; Magnesium  1.9; Potassium 4.5; Sodium 142   Recent Lipid Panel Lab Results  Component Value Date/Time   CHOL 129  04/18/2024 12:00 PM   TRIG 50 04/18/2024 12:00 PM   HDL 49 04/18/2024 12:00 PM   CHOLHDL 2.6 04/18/2024 12:00 PM   CHOLHDL 4 06/28/2019 02:30 PM   LDLCALC 69 04/18/2024 12:00 PM    Physical Exam:    VS:  BP 128/87 (BP Location: Left Arm, Patient Position: Sitting, Cuff Size: Normal)   Pulse 64   Resp 16   Ht 5' 6 (1.676 m)   Wt 175 lb (79.4 kg)   SpO2 100%   BMI 28.25 kg/m     Wt Readings from Last 3 Encounters:  10/12/24 175 lb (79.4 kg)  05/16/24 184 lb 6.4 oz (83.6 kg)  04/18/24 187 lb (84.8 kg)     GEN:  Well nourished, well developed in no acute distress HEENT: Normal NECK: No JVD; No carotid bruits LYMPHATICS: No lymphadenopathy CARDIAC: RRR, no murmurs, rubs, gallops RESPIRATORY:  Clear to auscultation without rales, wheezing or rhonchi  ABDOMEN: Soft, non-tender, non-distended MUSCULOSKELETAL:  No edema; No deformity  SKIN: Warm and dry NEUROLOGIC:  Alert and oriented x 3 PSYCHIATRIC:  Normal affect    Risk Assessment/Risk Calculators:                 ASSESSMENT & PLAN:    Chronic hypertension Obesity  Hypertension -thankfully her blood pressure is at goal, we will continue her current dose of hydrochlorothiazide  12.5 mg daily.  Family history of Premature heart disease -recent LP(a), lipid profile all within normal limits  Obesity-she is on Wegovy and is doing well   The patient understands the need to lose weight with diet and exercise. We have discussed specific strategies for this.  The patient is in agreement with the above plan. The patient left the office in stable condition.  The patient will follow up in 6 months or sooner if needed.  Patient Instructions  Medication Instructions:  None *If you need a refill on your cardiac medications before your next appointment, please call your pharmacy*  Lab Work: CMP & MAG If you have labs (blood work) drawn today and your tests are completely normal, you will receive your results only  by: MyChart Message (if you have MyChart) OR A paper copy in the mail If you have any lab test that is abnormal or we need to change your treatment, we will call you to review the results.  Testing/Procedures: None  Follow-Up: At [facility],  you and your health needs are our priority.  As part of our continuing mission to provide you with exceptional heart care, our providers are all part of one team.  This team includes your primary Cardiologist (physician) and Advanced Practice Providers or APPs (Physician Assistants and Nurse Practitioners) who all work together to provide you with the care you need, when you need it.  Your next appointment:   9 month(s)  Provider:   Shondell Poulson, DO    We recommend signing up for the patient portal called MyChart.  Sign up information is provided on this After Visit Summary.  MyChart is used to connect with patients for Virtual Visits (Telemedicine).  Patients are able to view lab/test results, encounter notes, upcoming appointments, etc.  Non-urgent messages can be sent to your provider as well.   To learn more about what you can do with MyChart, go to [email or web address].              Dispo:  No follow-ups on file.   Medication Adjustments/Labs and Tests Ordered: Current medicines are reviewed at length with the patient today.  Concerns regarding medicines are outlined above.  Tests Ordered: Orders Placed This Encounter  Procedures   Basic metabolic panel with GFR   Magnesium    Ambulatory Referral to Primary Care (Establish Care)   Medication Changes: No orders of the defined types were placed in this encounter.

## 2024-10-12 NOTE — Patient Instructions (Signed)
 Medication Instructions:  None *If you need a refill on your cardiac medications before your next appointment, please call your pharmacy*  Lab Work: CMP & MAG If you have labs (blood work) drawn today and your tests are completely normal, you will receive your results only by: MyChart Message (if you have MyChart) OR A paper copy in the mail If you have any lab test that is abnormal or we need to change your treatment, we will call you to review the results.  Testing/Procedures: None  Follow-Up: At [facility], you and your health needs are our priority.  As part of our continuing mission to provide you with exceptional heart care, our providers are all part of one team.  This team includes your primary Cardiologist (physician) and Advanced Practice Providers or APPs (Physician Assistants and Nurse Practitioners) who all work together to provide you with the care you need, when you need it.  Your next appointment:   9 month(s)  Provider:   Kardie Tobb, DO    We recommend signing up for the patient portal called MyChart.  Sign up information is provided on this After Visit Summary.  MyChart is used to connect with patients for Virtual Visits (Telemedicine).  Patients are able to view lab/test results, encounter notes, upcoming appointments, etc.  Non-urgent messages can be sent to your provider as well.   To learn more about what you can do with MyChart, go to [email or web address].

## 2024-10-13 LAB — BASIC METABOLIC PANEL WITH GFR
BUN/Creatinine Ratio: 19 (ref 9–23)
BUN: 15 mg/dL (ref 6–20)
CO2: 25 mmol/L (ref 20–29)
Calcium: 10 mg/dL (ref 8.7–10.2)
Chloride: 101 mmol/L (ref 96–106)
Creatinine, Ser: 0.81 mg/dL (ref 0.57–1.00)
Glucose: 77 mg/dL (ref 70–99)
Potassium: 4.6 mmol/L (ref 3.5–5.2)
Sodium: 138 mmol/L (ref 134–144)
eGFR: 100 mL/min/1.73 (ref >59)

## 2024-10-13 LAB — MAGNESIUM: Magnesium: 2.1 mg/dL (ref 1.6–2.3)

## 2024-10-20 ENCOUNTER — Ambulatory Visit: Payer: Self-pay | Admitting: Cardiology

## 2024-10-31 ENCOUNTER — Other Ambulatory Visit (HOSPITAL_COMMUNITY): Payer: Self-pay

## 2024-11-03 ENCOUNTER — Other Ambulatory Visit (HOSPITAL_COMMUNITY): Payer: Self-pay
